# Patient Record
Sex: Female | Born: 1970
Health system: Southern US, Community
[De-identification: ages and names within clinical notes are randomized; demographics above are authoritative.]

## PROBLEM LIST (undated history)

## (undated) DIAGNOSIS — T7840XA Allergy, unspecified, initial encounter: Secondary | ICD-10-CM

## (undated) DIAGNOSIS — I96 Gangrene, not elsewhere classified: Secondary | ICD-10-CM

## (undated) DIAGNOSIS — K219 Gastro-esophageal reflux disease without esophagitis: Secondary | ICD-10-CM

## (undated) DIAGNOSIS — F419 Anxiety disorder, unspecified: Secondary | ICD-10-CM

## (undated) DIAGNOSIS — M069 Rheumatoid arthritis, unspecified: Secondary | ICD-10-CM

## (undated) DIAGNOSIS — I8290 Acute embolism and thrombosis of unspecified vein: Secondary | ICD-10-CM

## (undated) DIAGNOSIS — K2270 Barrett's esophagus without dysplasia: Secondary | ICD-10-CM

## (undated) DIAGNOSIS — Z049 Encounter for examination and observation for unspecified reason: Secondary | ICD-10-CM

## (undated) DIAGNOSIS — I7300 Raynaud's syndrome without gangrene: Secondary | ICD-10-CM

## (undated) HISTORY — DX: Barrett's esophagus without dysplasia: K22.70

## (undated) HISTORY — DX: Anxiety disorder, unspecified: F41.9

## (undated) HISTORY — DX: Allergy, unspecified, initial encounter: T78.40XA

## (undated) HISTORY — DX: Gangrene, not elsewhere classified: I96

## (undated) HISTORY — PX: ABDOMINAL HYSTERECTOMY: SHX81

## (undated) HISTORY — DX: Encounter for examination and observation for unspecified reason: Z04.9

---
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
## 1993-03-17 HISTORY — PX: CHOLECYSTECTOMY: SHX55

## 1993-03-17 HISTORY — PX: TUBAL LIGATION: SHX77

## 2000-05-15 HISTORY — PX: GROIN DISSECTION: SUR421

## 2001-03-17 DIAGNOSIS — T7840XA Allergy, unspecified, initial encounter: Secondary | ICD-10-CM

## 2001-03-17 HISTORY — DX: Allergy, unspecified, initial encounter: T78.40XA

## 2004-04-24 ENCOUNTER — Ambulatory Visit: Payer: Self-pay | Admitting: Family Medicine

## 2004-07-15 HISTORY — PX: MRI: SHX5353

## 2004-08-08 ENCOUNTER — Ambulatory Visit: Payer: Self-pay | Admitting: Family Medicine

## 2004-08-12 ENCOUNTER — Encounter: Admission: RE | Admit: 2004-08-12 | Discharge: 2004-08-12 | Payer: Self-pay | Admitting: Family Medicine

## 2004-08-27 ENCOUNTER — Ambulatory Visit: Payer: Self-pay | Admitting: Family Medicine

## 2004-11-27 ENCOUNTER — Ambulatory Visit: Payer: Self-pay | Admitting: Family Medicine

## 2005-02-19 ENCOUNTER — Encounter: Payer: Self-pay | Admitting: Family Medicine

## 2005-02-19 ENCOUNTER — Ambulatory Visit: Payer: Self-pay | Admitting: Family Medicine

## 2005-02-20 ENCOUNTER — Other Ambulatory Visit: Admission: RE | Admit: 2005-02-20 | Discharge: 2005-02-20 | Payer: Self-pay | Admitting: Family Medicine

## 2005-04-29 ENCOUNTER — Ambulatory Visit: Payer: Self-pay | Admitting: Family Medicine

## 2005-04-29 LAB — HM MAMMOGRAPHY: HM Mammogram: NORMAL

## 2006-09-21 ENCOUNTER — Encounter: Payer: Self-pay | Admitting: Family Medicine

## 2006-09-21 DIAGNOSIS — M129 Arthropathy, unspecified: Secondary | ICD-10-CM | POA: Insufficient documentation

## 2006-09-21 DIAGNOSIS — J309 Allergic rhinitis, unspecified: Secondary | ICD-10-CM | POA: Insufficient documentation

## 2006-09-21 DIAGNOSIS — Z87891 Personal history of nicotine dependence: Secondary | ICD-10-CM | POA: Insufficient documentation

## 2006-09-21 DIAGNOSIS — Z9189 Other specified personal risk factors, not elsewhere classified: Secondary | ICD-10-CM | POA: Insufficient documentation

## 2006-09-22 ENCOUNTER — Ambulatory Visit: Payer: Self-pay | Admitting: Family Medicine

## 2006-09-22 DIAGNOSIS — F17200 Nicotine dependence, unspecified, uncomplicated: Secondary | ICD-10-CM | POA: Insufficient documentation

## 2006-09-22 DIAGNOSIS — M79609 Pain in unspecified limb: Secondary | ICD-10-CM | POA: Insufficient documentation

## 2006-09-22 LAB — CONVERTED CEMR LAB
AST: 17 units/L (ref 0–37)
Albumin: 3.9 g/dL (ref 3.5–5.2)
Alkaline Phosphatase: 69 units/L (ref 39–117)
BUN: 5 mg/dL — ABNORMAL LOW (ref 6–23)
Basophils Absolute: 0.1 10*3/uL (ref 0.0–0.1)
Chloride: 105 meq/L (ref 96–112)
Eosinophils Absolute: 0.2 10*3/uL (ref 0.0–0.6)
GFR calc non Af Amer: 75 mL/min
HCT: 41.5 % (ref 36.0–46.0)
MCHC: 34.4 g/dL (ref 30.0–36.0)
MCV: 93.4 fL (ref 78.0–100.0)
Monocytes Relative: 5.1 % (ref 3.0–11.0)
Neutrophils Relative %: 45.8 % (ref 43.0–77.0)
Platelets: 310 10*3/uL (ref 150–400)
RBC: 4.45 M/uL (ref 3.87–5.11)
Sed Rate: 11 mm/hr (ref 0–25)
Sodium: 141 meq/L (ref 135–145)
Uric Acid, Serum: 4.3 mg/dL (ref 2.4–7.0)

## 2006-09-23 ENCOUNTER — Ambulatory Visit: Payer: Self-pay | Admitting: Vascular Surgery

## 2006-09-23 ENCOUNTER — Encounter: Payer: Self-pay | Admitting: Family Medicine

## 2006-09-23 HISTORY — PX: OTHER SURGICAL HISTORY: SHX169

## 2006-10-08 ENCOUNTER — Encounter: Payer: Self-pay | Admitting: Family Medicine

## 2006-11-20 ENCOUNTER — Ambulatory Visit: Payer: Self-pay | Admitting: Family Medicine

## 2006-11-20 DIAGNOSIS — F411 Generalized anxiety disorder: Secondary | ICD-10-CM | POA: Insufficient documentation

## 2006-11-20 DIAGNOSIS — R079 Chest pain, unspecified: Secondary | ICD-10-CM | POA: Insufficient documentation

## 2006-11-27 ENCOUNTER — Ambulatory Visit: Payer: Self-pay | Admitting: Family Medicine

## 2007-03-25 ENCOUNTER — Ambulatory Visit: Payer: Self-pay | Admitting: Family Medicine

## 2007-03-25 DIAGNOSIS — R42 Dizziness and giddiness: Secondary | ICD-10-CM | POA: Insufficient documentation

## 2007-03-31 ENCOUNTER — Encounter: Payer: Self-pay | Admitting: Family Medicine

## 2007-04-28 ENCOUNTER — Encounter: Payer: Self-pay | Admitting: Family Medicine

## 2007-04-28 DIAGNOSIS — Z049 Encounter for examination and observation for unspecified reason: Secondary | ICD-10-CM

## 2007-04-28 HISTORY — DX: Encounter for examination and observation for unspecified reason: Z04.9

## 2007-06-02 ENCOUNTER — Encounter: Payer: Self-pay | Admitting: Family Medicine

## 2007-07-26 ENCOUNTER — Ambulatory Visit: Payer: Self-pay | Admitting: Family Medicine

## 2007-07-26 DIAGNOSIS — I7300 Raynaud's syndrome without gangrene: Secondary | ICD-10-CM | POA: Insufficient documentation

## 2007-08-27 ENCOUNTER — Ambulatory Visit: Payer: Self-pay | Admitting: Family Medicine

## 2007-08-30 ENCOUNTER — Emergency Department: Payer: Self-pay | Admitting: Emergency Medicine

## 2007-08-31 ENCOUNTER — Ambulatory Visit: Payer: Self-pay | Admitting: Family Medicine

## 2007-08-31 DIAGNOSIS — L989 Disorder of the skin and subcutaneous tissue, unspecified: Secondary | ICD-10-CM | POA: Insufficient documentation

## 2007-09-01 ENCOUNTER — Encounter: Payer: Self-pay | Admitting: Family Medicine

## 2008-02-23 ENCOUNTER — Ambulatory Visit: Payer: Self-pay | Admitting: Family Medicine

## 2009-02-18 ENCOUNTER — Emergency Department: Payer: Self-pay | Admitting: Emergency Medicine

## 2009-03-22 ENCOUNTER — Telehealth: Payer: Self-pay | Admitting: Family Medicine

## 2009-07-03 ENCOUNTER — Ambulatory Visit: Payer: Self-pay | Admitting: Family Medicine

## 2009-10-08 ENCOUNTER — Telehealth: Payer: Self-pay | Admitting: Family Medicine

## 2009-10-23 ENCOUNTER — Encounter (INDEPENDENT_AMBULATORY_CARE_PROVIDER_SITE_OTHER): Payer: Self-pay | Admitting: *Deleted

## 2009-10-30 ENCOUNTER — Ambulatory Visit: Payer: Self-pay | Admitting: Family Medicine

## 2009-10-30 DIAGNOSIS — J209 Acute bronchitis, unspecified: Secondary | ICD-10-CM | POA: Insufficient documentation

## 2010-01-14 ENCOUNTER — Ambulatory Visit: Payer: Self-pay | Admitting: Family Medicine

## 2010-04-16 NOTE — Assessment & Plan Note (Signed)
Summary: sore throat, congestion, cough/alc   Vital Signs:  Patient profile:   40 year old female Weight:      121.25 pounds O2 Sat:      97 % on Room air Temp:     98.8 degrees F oral Pulse rate:   88 / minute Pulse rhythm:   regular BP sitting:   112 / 80  (left arm) Cuff size:   regular  Vitals Entered By: Sydell Axon LPN (October 30, 2009 2:25 PM)  O2 Flow:  Room air CC: Sore throat, chest congestion, cough and tightness in chest   History of Present Illness: duration of symptoms: 5 days rhinorrhea:yes, initially congestion:yes no fevers ear pain: occ pain sore throat: minimal; raspy from coughing cough:yes, no sputum myalgias: back aches from coughing other concerns: smoker, prev PPD.  decreased over last few days.  husband sick with pulmonary embolism Prev was on advair.  Not on SABA currently.   Had been taking left over amoxil w/o relief.    Allergies: 1)  ! Sulfamethoxazole (Sulfamethoxazole)  Review of Systems       See HPI.  Otherwise negative.    Physical Exam  General:  GEN: nad, alert and oriented HEENT: mucous membranes moist, TM w/o erythema, nasal epithelium injected, OP with cobblestoning NECK: supple w/o LA CV: rrr. PULM: diffuse wheeze and ronchi bilaterally, no inc wob.  decrease in wheeze and ronchi after tx with albuterol HFA here in clinic.   ABD: soft, +bs EXT: no edema    Impression & Recommendations:  Problem # 1:  ACUTE BRONCHITIS (ICD-466.0)  Sent home with albuterol HFA.  Routine instructions/demonstrated and she understood.  follow up as needed.  d/w patient ZO:XWRUEAV.  I would go ahead and treat with antibiotics given the exam.  Nontoxic.   Her updated medication list for this problem includes:    Zithromax Z-pak 250 Mg Tabs (Azithromycin) .Marland Kitchen... 2 tabs today and then 1 by mouth qday x4d  Complete Medication List: 1)  Zoloft 50 Mg Tabs (Sertraline hcl) .... Take one by mouth daily 2)  Alprazolam 0.25 Mg Tabs (Alprazolam)  .... One tab by mouth two times a day as needed 3)  Zithromax Z-pak 250 Mg Tabs (Azithromycin) .... 2 tabs today and then 1 by mouth qday x4d  Patient Instructions: 1)  Use the inhaler every 4 hours as needed for cough or wheeze.  Take the antibiotics and follow up as needed.   Prescriptions: ZITHROMAX Z-PAK 250 MG TABS (AZITHROMYCIN) 2 tabs today and then 1 by mouth qday x4d  #6 tabs x 0   Entered and Authorized by:   Crawford Givens MD   Signed by:   Crawford Givens MD on 10/30/2009   Method used:   [facility]        [facility]  [address] [phone][facility] (retail[facility]       [address]       [place], [place]  [address]       Ph: [phone]       Fax: [phone]   RxID:   [id number]   Current Allergies (reviewed today): ! SULFAMETHOXAZOLE (SULFAMETHOXAZOLE)

## 2010-04-16 NOTE — Assessment & Plan Note (Signed)
Summary: med refill/rbh   Vital Signs:  Patient profile:   40 year old female Height:      65 inches Weight:      126.25 pounds BMI:     21.09 Temp:     98.6 degrees F oral Pulse rate:   84 / minute Pulse rhythm:   regular BP sitting:   100 / 70  (left arm) Cuff size:   regular  Vitals Entered By: Sydell Axon LPN (July 03, 2009 11:52 AM) CC: Needs a refill on medication   History of Present Illness: Pt here for refill of her Zoloft. She has stress due to mild fmily issues and financial issues. She has done well with the Zoloft and would like to continue.  She also relates that she has not been using Xanax for some time but there are times when she would like to have some. Overall she has survived ok. Shenis still smoking, is back to it after having been successful at quitting using Chantix and would like a prescription for it again. She tolerated it fine.  Problems Prior to Update: 1)  Skin Lesions, Multiple  (ICD-709.9) 2)  Raynauds Syndrome  (ICD-443.0) 3)  Vertigo  (ICD-780.4) 4)  Anxiety State Nos  (ICD-300.00) 5)  Chest Pain, Acute  (ICD-786.50) 6)  Tobacco Abuse  (ICD-305.1) 7)  Thumb Pain, Left  (ICD-729.5) 8)  Headache, Chronic, Hx of  (ICD-V15.9) 9)  Hx, Personal, Tobacco Use  (ICD-V15.82) 10)  Arthritis  (ICD-716.90) 11)  Allergic Rhinitis  (ICD-477.9)  Medications Prior to Update: 1)  Zoloft 50 Mg  Tabs (Sertraline Hcl) .... 1/2 Tab  By Mouth For Four Days, Then One Pill. 2)  Alprazolam 0.25 Mg  Tbdp (Alprazolam) .... One Tab By Mouth Bid  Allergies: 1)  ! Sulfamethoxazole (Sulfamethoxazole)  Physical Exam  General:  Well-developed,well-nourished,in no acute distress; alert,appropriate and cooperative throughout examination Head:  Normocephalic and atraumatic without obvious abnormalities. No apparent alopecia or balding. Eyes:  Conjunctiva clear bilaterally.  Ears:  External ear exam shows no significant lesions or deformities.  Otoscopic examination  reveals clear canals, tympanic membranes are intact bilaterally without bulging, retraction, inflammation or discharge. Hearing is grossly normal bilaterally. Nose:  External nasal examination shows no deformity or inflammation. Nasal mucosa are pink and moist without lesions or exudates. Mouth:  Oral mucosa and oropharynx without lesions or exudates.  Teeth in good repair. Neck:  No deformities, masses, or tenderness noted. Chest Wall:  No deformities, masses, or tenderness noted. Lungs:  Normal respiratory effort, chest expands symmetrically. Lungs are clear to auscultation, no crackles or wheezes. Heart:  Normal rate and regular rhythm. S1 and S2 normal without gallop, murmur, click, rub or other extra sounds. Abdomen:  Bowel sounds positive,abdomen soft and non-tender without masses, organomegaly or hernias noted.   Impression & Recommendations:  Problem # 1:  ANXIETY STATE NOS (ICD-300.00) Assessment Unchanged  Stable on Zoloft. Will add some Xanax for augmentation.  The following medications were removed from the medication list:    Alprazolam 0.25 Mg Tbdp (Alprazolam) ..... One tab by mouth bid Her updated medication list for this problem includes:    Zoloft 50 Mg Tabs (Sertraline hcl) .Marland Kitchen... Take one by mouth daily    Alprazolam 0.25 Mg Tabs (Alprazolam) ..... One tab by mouth two times a day as needed  Problem # 2:  TOBACCO ABUSE (ICD-305.1) Assessment: Deteriorated Back smoking again...will try Chantix again. Discussed tapering up and tapering off. Her updated medication list for this  problem includes:    Chantix 1 Mg Tabs (Varenicline tartrate) ..... One tab by mouth two times a day  Problem # 3:  RAYNAUDS SYNDROME (ICD-443.0) Assessment: Unchanged Stable. Has not had any acute problems lately.  Complete Medication List: 1)  Zoloft 50 Mg Tabs (Sertraline hcl) .... Take one by mouth daily 2)  Alprazolam 0.25 Mg Tabs (Alprazolam) .... One tab by mouth two times a day as  needed 3)  Chantix 1 Mg Tabs (Varenicline tartrate) .... One tab by mouth two times a day  Patient Instructions: 1)  RTC as needed. Prescriptions: CHANTIX 1 MG TABS (VARENICLINE TARTRATE) one tab by mouth two times a day  #60 x 5   Entered and Authorized by:   Shaune Leeks MD   Signed by:   Shaune Leeks MD on 07/03/2009   Method used:   Print then Give to Patient   RxID:   [id number] ALPRAZOLAM 0.25 MG TABS (ALPRAZOLAM) one tab by mouth two times a day as needed  #60 x 5   Entered and Authorized by:   Shaune Leeks MD   Signed by:   Shaune Leeks MD on 07/03/2009   Method used:   Print then Give to Patient   RxID:   [id number] ZOLOFT 50 MG  TABS (SERTRALINE HCL) Take one by mouth daily  #30 x 12   Entered and Authorized by:   Shaune Leeks MD   Signed by:   Shaune Leeks MD on 07/03/2009   Method used:   [facility]        [facility] [phone]* (retail)       [address]       [place], [place]  [address]       Ph: [phone]       Fax: [phone]   RxID:   [id number]   Current Allergies (reviewed today): ! SULFAMETHOXAZOLE (SULFAMETHOXAZOLE)

## 2010-04-16 NOTE — Letter (Signed)
Summary: Nadara Eaton letter  [place] [facility]  [address] [place], [place] [address]   Phone: [phone]  Fax: [phone]       10/23/2009 MRN: [id number]  Krystal Mercado [address] [place], [place]  [address]  Dear Ms. [name] and [facility] announce the retirement of Arta Silence, M.D., from full-time practice at the [facility] office effective September 13, 2009 and his plans of returning part-time.  It is important to Dr. Hetty Ely and to our practice that you understand that [facility] has seven physicians in our office for your health care needs.  We will continue to offer the same exceptional care that you have today.    Dr. Hetty Ely has spoken to many of you about his plans for retirement and returning part-time in the fall.   We will continue to work with you through the transition to schedule appointments for you in the office and meet the high standards that [place] is committed to.   Again, it is with great pleasure that we share the news that Dr. Hetty Ely will return to [facility] in October of 2011 with a reduced schedule.    If you have any questions, or would like to request an appointment with one of our physicians, please call us at [phone] and press the option for Scheduling an appointment.  We take pleasure in providing you with excellent patient care and look forward to seeing you at your next office visit.  Our [facility] Physicians are:  Tillman Abide, M.D. Laurita Quint, M.D. Roxy Manns, M.D. Kerby Nora, M.D. Hannah Beat, M.D. Ruthe Mannan, M.D. We proudly welcomed Raechel Ache, M.D. and Eustaquio Boyden, M.D. to the practice in July/August 2011.  Sincerely,  [facility]

## 2010-04-16 NOTE — Assessment & Plan Note (Signed)
Summary: CHEST CONGESTION/DLO   Vital Signs:  Patient profile:   40 year old female Height:      65 inches Weight:      121.50 pounds BMI:     20.29 Temp:     97.9 degrees F oral Pulse rate:   80 / minute Pulse rhythm:   regular BP sitting:   110 / 80  (left arm) Cuff size:   regular  Vitals Entered By: Delilah Shan CMA Hazeline Charnley Dull) (January 14, 2010 3:02 PM) CC: Chest congestion   History of Present Illness: "I've got a cold in my chest."  Nonproductive cough.  Wheezing.  Started Friday.  Took some leftover amoxil w/o relief.  No FNAVD.  Occ chills.  Chest feels tight.  Voice change, ST improved today.  Had prev used SABA with some relief, but out of it now.  Smoker, thinking about quitting.   Allergies: 1)  ! Sulfamethoxazole (Sulfamethoxazole)  Review of Systems       See HPI.  Otherwise negative.    Physical Exam  General:  GEN: nad, alert and oriented HEENT: mucous membranes moist, TM w/o erythema, nasal epithelium injected, OP without cobblestoning NECK: supple w/o LA CV: rrr. PULM: diffuse wheeze and ronchi bilaterally, no inc wob.   ABD: soft, +bs EXT: no edema    Impression & Recommendations:  Problem # 1:  ACUTE BRONCHITIS (ICD-466.0) Start antibiotics as below and restart SABA.  I d/w patient re: tobacco and she is considering stopping.  I am hopeful that she'll try to follow through with this.  Okay for outpatient follow up.  No increase in wob.  She understands the plan.  follow up as needed.  The following medications were removed from the medication list:    Zithromax Z-pak 250 Mg Tabs (Azithromycin) .Marland Kitchen... 2 tabs today and then 1 by mouth qday x4d Her updated medication list for this problem includes:    Zithromax 250 Mg Tabs (Azithromycin) .Marland Kitchen... 2 by mouth once daily today and then 1 by mouth once daily for 4 days.    Proair Hfa 108 (90 Base) Mcg/act Aers (Albuterol sulfate) .Marland Kitchen... 2 puffs q4h as needed for cough  Complete Medication List: 1)  Zoloft 50 Mg  Tabs (Sertraline hcl) .... Take one by mouth daily 2)  Alprazolam 0.25 Mg Tabs (Alprazolam) .... One tab by mouth two times a day as needed 3)  Zithromax 250 Mg Tabs (Azithromycin) .... 2 by mouth once daily today and then 1 by mouth once daily for 4 days. 4)  Proair Hfa 108 (90 Base) Mcg/act Aers (Albuterol sulfate) .... 2 puffs q4h as needed for cough  Patient Instructions: 1)  [place] the inhaler as needed and take the antibiotics as directed.  This should gradually get better.  Let us know if we can help you get off cigarettes.  Take care.  Follow up as needed.  Prescriptions: PROAIR HFA 108 (90 BASE) MCG/ACT AERS (ALBUTEROL SULFATE) 2 puffs q4h as needed for cough  #1 x 1   Entered and Authorized by:   Crawford Givens MD   Signed by:   Crawford Givens MD on 01/14/2010   Method used:   [facility]        [facility]  [address] [phone]* (retail)       [address]       [place], [place]  [address]       Ph: [phone]  Fax: [phone]   RxID:   [id number] ZITHROMAX 250 MG TABS (AZITHROMYCIN) 2 by mouth once daily today and then 1 by mouth once daily for 4 days.  #6 x 0   Entered and Authorized by:   Crawford Givens MD   Signed by:   Crawford Givens MD on 01/14/2010   Method used:   [facility]        [facility]  [address] [phone]* (retail)       [address]       [place], [place]  [address]       Ph: [phone]       Fax: [phone]   RxID:   [id number]    Orders Added: 1)  Est. Patient Level III [01093]    Current Allergies (reviewed today): ! SULFAMETHOXAZOLE (SULFAMETHOXAZOLE)

## 2010-04-16 NOTE — Progress Notes (Signed)
Summary: ok to increase xanax?  Phone Note Call from Patient   Caller: Patient Summary of Call: Pt states she is under a lot of stress with taking care of her husband, who just got out of the hospital with heart failure and pulmonary embolism.  She is taking one xanax at bedtime, her instructions say she can take 2 a day.  Advised pt ok to increase to 2 a day if she tolerates this.  Advised her to continue with the one she takes at bedtime and try taking one half during the day, if that doesnt help she can increase to a whole one during the day, but warned her about drowsiness. Initial call taken by: Lowella Petties CMA,  October 08, 2009 12:20 PM  Follow-up for Phone Call        agree and follow up as needed.  thanks.  Follow-up by: Crawford Givens MD,  October 08, 2009 12:49 PM

## 2010-04-16 NOTE — Progress Notes (Signed)
Summary: Rx Zoloft  Phone Note Refill Request Call back at [phone] Message [facility] on March 22, 2009 9:01 AM  Refills Requested: Medication #1:  ZOLOFT 50 MG  TABS 1/2 tab  by mouth for four days   Last Refilled: 02/22/2009 Received refill request, please advise   Method Requested: Electronic Initial call taken by: Linde Gillis CMA Duncan Dull),  March 22, 2009 9:02 AM  Follow-up for Phone Call        Rx called to pharmacy. Spoke with patient and advised her to schedule an appointment with Dr. Hetty Ely this month.  She says she will call back to schedule the appointmet, she has no insurance that's why she had not scheduled an appointment before now. Follow-up by: Linde Gillis CMA Duncan Dull),  March 22, 2009 9:45 AM    Prescriptions: ZOLOFT 50 MG  TABS (SERTRALINE HCL) 1/2 tab  by mouth for four days, then one pill.  #30 x 0   Entered and Authorized by:   Shaune Leeks MD   Signed by:   Shaune Leeks MD on 03/22/2009   Method used:   Telephoned to ...         RxID:   [id number]  Please have pt make appt to be seen within the next month. Shaune Leeks MD  March 22, 2009 9:26 AM

## 2010-07-22 ENCOUNTER — Other Ambulatory Visit: Payer: Self-pay | Admitting: Family Medicine

## 2010-09-16 ENCOUNTER — Other Ambulatory Visit: Payer: Self-pay | Admitting: *Deleted

## 2010-09-16 MED ORDER — SERTRALINE HCL 50 MG PO TABS: 50.0000 mg | ORAL_TABLET | Freq: Every day | ORAL | Status: DC

## 2010-09-21 ENCOUNTER — Encounter: Payer: Self-pay | Admitting: Family Medicine

## 2010-10-02 ENCOUNTER — Ambulatory Visit (INDEPENDENT_AMBULATORY_CARE_PROVIDER_SITE_OTHER): Payer: Self-pay | Admitting: Family Medicine

## 2010-10-02 ENCOUNTER — Encounter: Payer: Self-pay | Admitting: Family Medicine

## 2010-10-02 DIAGNOSIS — I7300 Raynaud's syndrome without gangrene: Secondary | ICD-10-CM

## 2010-10-02 DIAGNOSIS — Z87891 Personal history of nicotine dependence: Secondary | ICD-10-CM

## 2010-10-02 DIAGNOSIS — F411 Generalized anxiety disorder: Secondary | ICD-10-CM

## 2010-10-02 DIAGNOSIS — Z23 Encounter for immunization: Secondary | ICD-10-CM

## 2010-10-02 MED ORDER — ALPRAZOLAM 0.25 MG PO TABS: 0.2500 mg | ORAL_TABLET | Freq: Every evening | ORAL | Status: DC | PRN

## 2010-10-02 MED ORDER — SERTRALINE HCL 50 MG PO TABS: 50.0000 mg | ORAL_TABLET | Freq: Every day | ORAL | Status: DC

## 2010-10-02 NOTE — Progress Notes (Signed)
  Subjective:    Patient ID: Krystal Mercado, female    DOB: 07-21-1970, 40 y.o.   MRN: [id number]  HPI Pt here for refill of her medications. She has been seen for bronchitis by Dr Para March in my absence but otherwise not seen in a year. She has been busy but good.  She has done well and really has no complaints. 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 The business is all blue on the outside. She has not had a PAp in a while. Last mammo was years ago as well. She prefers not to organize that today.    Review of Systems  Constitutional: Negative for fever, chills, diaphoresis, activity change and fatigue.  HENT: Negative for ear pain, congestion, rhinorrhea and postnasal drip.   Eyes: Negative for redness.  Respiratory: Negative for cough, chest tightness, shortness of breath and wheezing.   Cardiovascular: Negative for chest pain.       Objective:   Physical Exam  Constitutional: She appears well-developed and well-nourished. No distress.  HENT:  Head: Normocephalic and atraumatic.  Right Ear: External ear normal.  Left Ear: External ear normal.  Nose: Nose normal.  Mouth/Throat: Oropharynx is clear and moist. No oropharyngeal exudate.  Eyes: Conjunctivae and EOM are normal. Pupils are equal, round, and reactive to light.  Neck: Normal range of motion. Neck supple. No thyromegaly present.  Cardiovascular: Normal rate, regular rhythm and normal heart sounds.   Pulmonary/Chest: Effort normal and breath sounds normal. She has no wheezes. She has no rales.  Lymphadenopathy:    She has no cervical adenopathy.  Skin: She is not diaphoretic.          Assessment & Plan:

## 2010-10-02 NOTE — Assessment & Plan Note (Signed)
Continues smoking which, if she could stop, would help multiple problems, Raynaud's and bronchitis to name two. Discussed.

## 2010-10-02 NOTE — Assessment & Plan Note (Signed)
Smoking cessation would help. ASA is reasonable approach in the meantime.

## 2010-10-02 NOTE — Patient Instructions (Signed)
Return after the first of the year to establish with new doctor and get heath maintenance back up to date.

## 2010-10-02 NOTE — Assessment & Plan Note (Signed)
Cont Sertraline as seems to help and is not a worry with habituation. She also requests some Xanax if possible. She sleeps better with being able to take it, hasn't had any for a while.  Both scripts printed and signed and given to the pt.

## 2011-06-05 ENCOUNTER — Ambulatory Visit: Payer: Self-pay | Admitting: Vascular Surgery

## 2011-06-05 LAB — BASIC METABOLIC PANEL
Anion Gap: 9 (ref 7–16)
Calcium, Total: 9 mg/dL (ref 8.5–10.1)
Chloride: 105 mmol/L (ref 98–107)
Co2: 25 mmol/L (ref 21–32)
Osmolality: 276 (ref 275–301)

## 2011-06-06 ENCOUNTER — Encounter: Payer: Self-pay | Admitting: Family Medicine

## 2011-06-06 ENCOUNTER — Other Ambulatory Visit: Payer: Self-pay | Admitting: *Deleted

## 2011-06-06 MED ORDER — SERTRALINE HCL 50 MG PO TABS: 50.0000 mg | ORAL_TABLET | Freq: Every day | ORAL | Status: DC

## 2011-07-18 ENCOUNTER — Telehealth: Payer: Self-pay | Admitting: Family Medicine

## 2011-07-18 NOTE — Telephone Encounter (Signed)
Pt is needing Oxycodon 5 mg she was prescribed from ER. She is taking 1 to 2 every 4 to 6 hours. She is having vascular problem with left thumb and left index finger and in a lot of pain. Left thumb is almost completely black. She had been referred to vascular surgeon but cant get in to see him until 5 /8/13. She was only given 30 Oxycodon. Tried to call ER and they told her to call primary care.

## 2011-07-18 NOTE — Telephone Encounter (Signed)
I called her.  The previously grey skin is now black and shiny.  The affected area has enlarged.  I reviewed the [facility] notes, advised her to go to ER, [facility] if possible as they had seen patient recently.  She understood.  I wouldn't delay ER eval.

## 2011-07-31 ENCOUNTER — Encounter: Payer: Self-pay | Admitting: Family Medicine

## 2011-08-27 ENCOUNTER — Other Ambulatory Visit: Payer: Self-pay

## 2011-08-27 MED ORDER — SERTRALINE HCL 50 MG PO TABS: 50.0000 mg | ORAL_TABLET | Freq: Every day | ORAL | Status: DC

## 2011-08-27 NOTE — Telephone Encounter (Signed)
Pt request refill sertraline [facility]. Pt out of med. Last seen by Dr Hetty Ely 10/02/10. Pt has lost insurance and when makes appt to see Dr Para March can it be f/u appt rather than CPX?

## 2011-08-27 NOTE — Telephone Encounter (Signed)
Advised patient, follow up appt made.

## 2011-08-27 NOTE — Telephone Encounter (Signed)
F/u is fine. rx sent.

## 2011-09-01 ENCOUNTER — Encounter: Payer: Self-pay | Admitting: Family Medicine

## 2011-09-01 ENCOUNTER — Ambulatory Visit (INDEPENDENT_AMBULATORY_CARE_PROVIDER_SITE_OTHER): Payer: Self-pay | Admitting: Family Medicine

## 2011-09-01 VITALS — BP 142/86 | HR 94 | Temp 98.6°F | Wt 121.8 lb

## 2011-09-01 DIAGNOSIS — F411 Generalized anxiety disorder: Secondary | ICD-10-CM

## 2011-09-01 DIAGNOSIS — M79609 Pain in unspecified limb: Secondary | ICD-10-CM

## 2011-09-01 MED ORDER — ALPRAZOLAM 0.25 MG PO TABS: 0.2500 mg | ORAL_TABLET | Freq: Every evening | ORAL | Status: DC | PRN

## 2011-09-01 NOTE — Patient Instructions (Addendum)
Take the sertraline daily and the xanax at night.  Take care.  Let me know if I can help you stop smoking.  Take care.

## 2011-09-01 NOTE — Progress Notes (Signed)
Per patient, she was deemed "medically uninsurable" by her insurance company.  L thumb with distal necrosis.  She has f/u pending with the hand surgery clinic but further imaging was deferred at this point due to cost.  She is off pain meds and NTG.  The L thumb lesion is distally necrotic and is loosening up.  Still smoking, discussed.    Divorce was pending as of 2013, unclear if this will proceed and is still living with her husband.  She is safe at home.  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    She was anxious before the above happened.  Not sleeping well.  No Si/Hi.  Contacts for safety.  Sertraline helps some.  Has been off xanax, would take it at night for sleep- no help with otc sleep aids, ie melatonin, valerian root.    Meds, vitals, and allergies reviewed.   ROS: See HPI.  Otherwise, noncontributory.  nad ncat Mmm rrr ctab L thumb with distal necrotic tip but no erythema O/w sensation and vasc exam wnl for remaining digits.

## 2011-09-02 NOTE — Assessment & Plan Note (Signed)
Per vasc/hand surgery. D/w pt about smoking, cessation.

## 2011-09-02 NOTE — Assessment & Plan Note (Signed)
Continue ssri, restart bzd for sleep/anxiety.  Okay for outpatient f/u.  She's trying to work through her situation with work and marriage.

## 2012-01-21 ENCOUNTER — Other Ambulatory Visit: Payer: Self-pay | Admitting: Family Medicine

## 2012-01-21 NOTE — Telephone Encounter (Signed)
Received refill electronically. Last office visit 06/13. Is it okay to refill medication?

## 2012-01-21 NOTE — Telephone Encounter (Signed)
Sent!

## 2012-05-10 ENCOUNTER — Ambulatory Visit (INDEPENDENT_AMBULATORY_CARE_PROVIDER_SITE_OTHER): Payer: Self-pay | Admitting: Family Medicine

## 2012-05-10 ENCOUNTER — Encounter: Payer: Self-pay | Admitting: Family Medicine

## 2012-05-10 VITALS — BP 122/80 | HR 92 | Temp 98.6°F | Wt 126.0 lb

## 2012-05-10 DIAGNOSIS — R05 Cough: Secondary | ICD-10-CM

## 2012-05-10 DIAGNOSIS — R059 Cough, unspecified: Secondary | ICD-10-CM | POA: Insufficient documentation

## 2012-05-10 LAB — POCT INFLUENZA A/B: Influenza A, POC: NEGATIVE

## 2012-05-10 MED ORDER — ALBUTEROL SULFATE (2.5 MG/3ML) 0.083% IN NEBU
2.5000 mg | INHALATION_SOLUTION | Freq: Once | RESPIRATORY_TRACT | Status: AC
  Administered 2012-05-10: 2.5 mg via RESPIRATORY_TRACT

## 2012-05-10 MED ORDER — PREDNISONE 20 MG PO TABS: 20.0000 mg | ORAL_TABLET | Freq: Every day | ORAL | Status: DC

## 2012-05-10 MED ORDER — ALBUTEROL SULFATE HFA 108 (90 BASE) MCG/ACT IN AERS: 2.0000 | INHALATION_SPRAY | Freq: Four times a day (QID) | RESPIRATORY_TRACT | Status: DC | PRN

## 2012-05-10 MED ORDER — AZITHROMYCIN 250 MG PO TABS: ORAL_TABLET | ORAL | Status: DC

## 2012-05-10 NOTE — Progress Notes (Signed)
duration of symptoms: started about 5 days ago Rhinorrhea: no congestion:yes ear pain:yes, R ear sore throat: yes Cough: yes Myalgias: yes No fevers known.  Had chills and sweats prev Cutting back on smoking- planning on quitting.  Can use E cig.  Fatigued.   Chest feels tight.   Some wheeze, had used inhaler in the past but not recently.  Her abdomen feels bloated. Some watery diarrhea, nonbloody.   She hasn't had a flu shot this year.    She started on 875 amoxil starting 4 days ago, taking it BID.   ROS: See HPI.  Otherwise negative.    Meds, vitals, and allergies reviewed.   GEN: nad, alert and oriented HEENT: mucous membranes moist, TM w/o erythema, nasal epithelium injected, OP with cobblestoning, sinuses not ttp NECK: supple w/o LA CV: rrr. PULM: no inc wob but diffuse polyphonic exp wheezes and rhonchi.  Inc in air movement on recheck after neb, but still with some wheeze present; still w/o inc in WOB ABD: soft, +bs EXT: no edema

## 2012-05-10 NOTE — Assessment & Plan Note (Signed)
Flu neg, would treat for bronchitis with zmax, pred (steroid caution), and prn SABA.  D/w pt about signs that would direct f/u.  Nontoxic.  Supportive tx o/w.  She agrees.

## 2012-05-10 NOTE — Patient Instructions (Addendum)
Take the prednisone with food.  Start the antibiotics today.  Use the inhaler as needed.  Take care.  Call back as needed.

## 2012-07-20 ENCOUNTER — Other Ambulatory Visit: Payer: Self-pay | Admitting: Family Medicine

## 2012-11-12 ENCOUNTER — Telehealth: Payer: Self-pay | Admitting: Family Medicine

## 2012-11-12 MED ORDER — SERTRALINE HCL 50 MG PO TABS: 50.0000 mg | ORAL_TABLET | Freq: Every day | ORAL | Status: DC

## 2012-11-12 NOTE — Telephone Encounter (Signed)
Please call pt.  Krystal Mercado was in yesterday.  Stated wife was running low on SSRI.  Please verify with patient and send sertraline, same sig #90, no rf, if needed.  Try to get her scheduled.  Thanks.

## 2012-11-12 NOTE — Telephone Encounter (Signed)
Krystal Mercado says he will try to get her to schedule OV soon, in financial straits right now.  Rx sent.

## 2013-02-09 ENCOUNTER — Other Ambulatory Visit: Payer: Self-pay | Admitting: Family Medicine

## 2013-03-05 ENCOUNTER — Other Ambulatory Visit: Payer: Self-pay | Admitting: Family Medicine

## 2013-03-07 NOTE — Telephone Encounter (Signed)
Received refill request electronically. Last office visit 05/10/12. Is it okay to refill medication?

## 2013-03-07 NOTE — Telephone Encounter (Signed)
90 sent, schedule when possible.  Thanks.

## 2013-03-08 NOTE — Telephone Encounter (Signed)
Patient advised.

## 2013-07-07 ENCOUNTER — Encounter: Payer: Self-pay | Admitting: Family Medicine

## 2013-07-07 ENCOUNTER — Ambulatory Visit (INDEPENDENT_AMBULATORY_CARE_PROVIDER_SITE_OTHER): Payer: BC Managed Care – PPO | Admitting: Family Medicine

## 2013-07-07 VITALS — BP 126/90 | HR 90 | Temp 98.1°F | Wt 126.8 lb

## 2013-07-07 DIAGNOSIS — F411 Generalized anxiety disorder: Secondary | ICD-10-CM

## 2013-07-07 MED ORDER — SERTRALINE HCL 100 MG PO TABS: 100.0000 mg | ORAL_TABLET | Freq: Every day | ORAL | Status: DC

## 2013-07-07 NOTE — Patient Instructions (Signed)
Try to quit smoking and increase to 100mg  of zoloft a day.  If not better, then let me know.   Take care.
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
## 2013-07-08 ENCOUNTER — Telehealth: Payer: Self-pay | Admitting: Family Medicine

## 2013-07-08 NOTE — Telephone Encounter (Signed)
Relevant patient education assigned to patient using Emmi. ° °

## 2013-07-08 NOTE — Assessment & Plan Note (Signed)
Inc SSRI, discussed smoking cessation. It would make sense for her to have more migraines during high stress period.  She is safe at home.  She'll update me if the inc in SSRI isn't helpful.

## 2013-07-09 ENCOUNTER — Other Ambulatory Visit: Payer: Self-pay | Admitting: Family Medicine

## 2014-01-31 ENCOUNTER — Other Ambulatory Visit: Payer: Self-pay | Admitting: Family Medicine

## 2014-01-31 NOTE — Telephone Encounter (Signed)
Received refill request electronically from pharmacy. Please advised which dose of Zoloft patient is to be on. Last office visit 07/07/13. Is it okay to refill medication?

## 2014-02-01 NOTE — Telephone Encounter (Signed)
Should be the 100mg .  Sent.  Thanks.

## 2014-07-09 NOTE — Op Note (Signed)
PATIENT NAME:  Krystal Mercado, Krystal Mercado MR#:  [id number] DATE OF BIRTH:  18-Jul-1970  DATE OF PROCEDURE:  06/05/2011  PREOPERATIVE DIAGNOSIS: Cyanosis and pain left first and second digits worrisome for embolic disease or occlusive disease.   POSTOPERATIVE DIAGNOSIS: Cyanosis and pain left first and second digits worrisome for embolic disease or occlusive disease.  PROCEDURES: 1. Catheter placement into left brachial artery from right femoral approach.  2. Thoracic aortogram and left upper extremity angiogram.  3. StarClose closure device, right femoral artery.   SURGEON: Algernon Huxley, MD   ANESTHESIA: Local with moderate conscious sedation.   ESTIMATED BLOOD LOSS: Minimal.   CONTRAST USED: 40 mL Visipaque.   INDICATION FOR PROCEDURE: This is a 44 year old white female with recurrent cyanosis and pain in the left first and second digits. She had had this problem five years ago. She's presented with a week or two of pain and she is brought in for an angiogram for further evaluation. Risks and benefits were discussed. Informed consent was obtained.   DESCRIPTION OF PROCEDURE: The patient was brought to the Vascular Interventional Radiology Suite. Groins were shaved and prepped and a sterile surgical field was created. The right femoral head was localized with fluoroscopy. The right femoral artery was accessed with a mild amount of difficulty with a Seldinger needle. A 3-J wire and 5 French sheath were placed. The patient was given 2500 units of intravenous heparin for systemic anticoagulation. A pigtail catheter was placed in the ascending aorta and LAO projection thoracic aortogram was then performed. This showed normal origins of the great vessels without significant aneurysm or occlusive disease of the proximal great vessels. I then cannulated the left subclavian artery without difficulty with a headhunter catheter and left subclavian axillary and brachial arteries were imaged with the catheter beyond  the vertebral artery and all were normal in their appearance. I then advanced the catheter down to the brachial artery to help visualize the radial and ulnar artery in the hand. This demonstrated normal ulnar artery, decent size in her osseous artery, a smaller radial artery which was patent into the hand with what appeared to be some moderate disease of the radial artery itself within the hand. The thumb and second finger did fill more slowly but did fill and there was an incomplete palmar arch that had been previously demonstrated on an angiogram five years ago. The ulnar artery was the dominant artery. At this point there was no role for endovascular revascularization and there was flow visualized into the first and second digits, although not as brisk as digits 3, 4, and 5. I elected to terminate the procedure. The diagnostic catheter was removed. Oblique arteriogram was performed and StarClose closure device was deployed in the usual fashion with excellent hemostatic result. The patient tolerated the procedure well and was taken to the recovery room in stable condition.   ____________________________ Algernon Huxley, MD jsd:drc D: 06/05/2011 11:37:03 ET T: 06/05/2011 11:58:58 ET JOB#: [id number]  cc: Algernon Huxley, MD, <Dictator> Dr. Elsie Stain  Algernon Huxley MD ELECTRONICALLY SIGNED 06/05/2011 15:44

## 2014-09-25 ENCOUNTER — Other Ambulatory Visit: Payer: Self-pay | Admitting: Family Medicine

## 2014-09-25 NOTE — Telephone Encounter (Signed)
Electronic refill request. Last office visit:   07/07/13 Last Filled:    90 tablet 1 02/01/2014  No upcoming appts scheduled.  Please advise.

## 2014-09-26 NOTE — Telephone Encounter (Signed)
Sent. Needs f/u scheduled. Thanks.

## 2014-09-26 NOTE — Telephone Encounter (Signed)
Patient advised.

## 2015-01-26 ENCOUNTER — Other Ambulatory Visit: Payer: Self-pay | Admitting: Family Medicine

## 2015-01-31 ENCOUNTER — Other Ambulatory Visit: Payer: Self-pay | Admitting: Family Medicine

## 2015-02-01 NOTE — Telephone Encounter (Signed)
Can't fill unless OV scheduled.  Can only fill though the OV, once scheduled.   Patient was advised re: f/u on last refill in 09/2014.

## 2015-02-01 NOTE — Telephone Encounter (Signed)
Last office visit 07/07/2013.  No future appointments scheduled.  Refill?

## 2015-02-02 NOTE — Telephone Encounter (Signed)
Message left for patient to return my call.  

## 2015-02-05 NOTE — Telephone Encounter (Signed)
Patient advised.   Patient states she does not have insurance right now and will call in to schedule OV and inquire of OV charge.

## 2015-03-23 ENCOUNTER — Encounter: Payer: Self-pay | Admitting: Primary Care

## 2015-03-23 ENCOUNTER — Ambulatory Visit (INDEPENDENT_AMBULATORY_CARE_PROVIDER_SITE_OTHER): Payer: Self-pay | Admitting: Primary Care

## 2015-03-23 VITALS — BP 138/76 | HR 102 | Temp 98.1°F | Wt 126.1 lb

## 2015-03-23 DIAGNOSIS — R0602 Shortness of breath: Secondary | ICD-10-CM

## 2015-03-23 MED ORDER — PREDNISONE 10 MG PO TABS: ORAL_TABLET | ORAL | Status: DC

## 2015-03-23 MED ORDER — ALBUTEROL SULFATE HFA 108 (90 BASE) MCG/ACT IN AERS: 2.0000 | INHALATION_SPRAY | Freq: Four times a day (QID) | RESPIRATORY_TRACT | Status: DC | PRN

## 2015-03-23 NOTE — Patient Instructions (Addendum)
Start prednisone tablets for chest tightness and breathing. Take 3 tablets for 2 days, then 2 tablets for 2 days, then 1 tablet for 2 days.   Use albuterol inhaler every 6 hours as needed for wheezing/SOB.  Continue Mucinex DM and ensure you are drinking a full glass of water.  Please call me Tuesday next week if no improvement.  It was a pleasure meeting you!  Upper Respiratory Infection, Adult Most upper respiratory infections (URIs) are a viral infection of the air passages leading to the lungs. A URI affects the nose, throat, and upper air passages. The most common type of URI is nasopharyngitis and is typically referred to as "the common cold." URIs run their course and usually go away on their own. Most of the time, a URI does not require medical attention, but sometimes a bacterial infection in the upper airways can follow a viral infection. This is called a secondary infection. Sinus and middle ear infections are common types of secondary upper respiratory infections. Bacterial pneumonia can also complicate a URI. A URI can worsen asthma and chronic obstructive pulmonary disease (COPD). Sometimes, these complications can require emergency medical care and may be life threatening.  CAUSES Almost all URIs are caused by viruses. A virus is a type of germ and can spread from one person to another.  RISKS FACTORS You may be at risk for a URI if:   You smoke.   You have chronic heart or lung disease.  You have a weakened defense (immune) system.   You are very young or very old.   You have nasal allergies or asthma.  You work in crowded or poorly ventilated areas.  You work in health care facilities or schools. SIGNS AND SYMPTOMS  Symptoms typically develop 2-3 days after you come in contact with a cold virus. Most viral URIs last 7-10 days. However, viral URIs from the influenza virus (flu virus) can last 14-18 days and are typically more severe. Symptoms may include:   Runny  or stuffy (congested) nose.   Sneezing.   Cough.   Sore throat.   Headache.   Fatigue.   Fever.   Loss of appetite.   Pain in your forehead, behind your eyes, and over your cheekbones (sinus pain).  Muscle aches.  DIAGNOSIS  Your health care provider may diagnose a URI by:  Physical exam.  Tests to check that your symptoms are not due to another condition such as:  Strep throat.  Sinusitis.  Pneumonia.  Asthma. TREATMENT  A URI goes away on its own with time. It cannot be cured with medicines, but medicines may be prescribed or recommended to relieve symptoms. Medicines may help:  Reduce your fever.  Reduce your cough.  Relieve nasal congestion. HOME CARE INSTRUCTIONS   Take medicines only as directed by your health care provider.   Gargle warm saltwater or take cough drops to comfort your throat as directed by your health care provider.  Use a warm mist humidifier or inhale steam from a shower to increase air moisture. This may make it easier to breathe.  Drink enough fluid to keep your urine clear or pale yellow.   Eat soups and other clear broths and maintain good nutrition.   Rest as needed.   Return to work when your temperature has returned to normal or as your health care provider advises. You may need to stay home longer to avoid infecting others. You can also use a face mask and careful hand washing  to prevent spread of the virus.  Increase the usage of your inhaler if you have asthma.   Do not use any tobacco products, including cigarettes, chewing tobacco, or electronic cigarettes. If you need help quitting, ask your health care provider. PREVENTION  The best way to protect yourself from getting a cold is to practice good hygiene.   Avoid oral or hand contact with people with cold symptoms.   Wash your hands often if contact occurs.  There is no clear evidence that vitamin C, vitamin E, echinacea, or exercise reduces the  chance of developing a cold. However, it is always recommended to get plenty of rest, exercise, and practice good nutrition.  SEEK MEDICAL CARE IF:   You are getting worse rather than better.   Your symptoms are not controlled by medicine.   You have chills.  You have worsening shortness of breath.  You have brown or red mucus.  You have yellow or brown nasal discharge.  You have pain in your face, especially when you bend forward.  You have a fever.  You have swollen neck glands.  You have pain while swallowing.  You have white areas in the back of your throat. SEEK IMMEDIATE MEDICAL CARE IF:   You have severe or persistent:  Headache.  Ear pain.  Sinus pain.  Chest pain.  You have chronic lung disease and any of the following:  Wheezing.  Prolonged cough.  Coughing up blood.  A change in your usual mucus.  You have a stiff neck.  You have changes in your:  Vision.  Hearing.  Thinking.  Mood. MAKE SURE YOU:   Understand these instructions.  Will watch your condition.  Will get help right away if you are not doing well or get worse.   This information is not intended to replace advice given to you by your health care provider. Make sure you discuss any questions you have with your health care provider.   Document Released: 08/27/2000 Document Revised: 07/18/2014 Document Reviewed: 06/08/2013 Elsevier Interactive Patient Education [employer].

## 2015-03-23 NOTE — Progress Notes (Signed)
Subjective:    Patient ID: Krystal Mercado, female    DOB: December 15, 1970, 46 y.o.   MRN: [id number]  HPI  Krystal Mercado is a 45 year old female who presents today with a chief complaint of cough. She also reports nasal congestion, sore throat body aches. Her symptoms have been since Tuesday afternoon this week (3 days). She is a current smoker. She's taken Mucinex DM with temporary improvement. Her most bothersome symptom is her SOB and chest tightness. She works in [employer] (medical). She does not currently use an inhaler.  Review of Systems  Constitutional: Negative for fever and chills.  HENT: Positive for congestion and sore throat. Negative for ear pain.   Respiratory: Positive for cough, chest tightness and shortness of breath.   Cardiovascular: Negative for chest pain.  Musculoskeletal: Positive for myalgias.       Past Medical History  Diagnosis Date  . Allergy 2003    allergic rhinitis  . Observation or evaluation for suspected condition 04/28/2007    heme Evaluation no known pathology (Dr.Metjian, [facility])  . Skin necrosis     left thumb- vascular eval at [facility]  . Marital Status: Unknown    Spouse Name: N/A  . Number of Children: N/A  . Years of Education: N/A   Occupational History  . Not on file.   Social History Main Topics  . Smoking status: Current Every Day Smoker -- 1.00 packs/day for 25 years    Types: Cigarettes  . Smokeless tobacco: Never Used  . Alcohol Use: Yes     Comment: wine at night  . Drug Use: No  . Sexual Activity: Not on file   Other Topics Concern  . Not on file   Social History Narrative    Past Surgical History  Procedure Laterality Date  . Tubal ligation  1995    conization, BTL; choleycystectomy  . Cholecystectomy  1995    conization, BTL  . Groin dissection  05/2000    Right groin Lymph node dissection for cat scratch Disease  . Cystectomy  1990    cyst in fallopian tube removed, Tube intact    . Vaginal delivery      X's 2  . Mri  07/2004    negative except sinusitis  . Thoracic aortogram  09/23/2006    Thoracic aortogram with select LUE arteriogram small radial artery below the brach bifurcation    Family History  Problem Relation Age of Onset  . Cancer Mother 62    breast cancer/lymphoma disease 10/2004  . Hypertension Father   . Alcohol abuse Brother   . Cancer Maternal Grandmother     bone cancer //young in 30's  . Alcohol abuse Maternal Grandmother   . Stroke Maternal Grandmother   . Cancer Maternal Grandfather     ? bone cancer./ prostate/colon cancer    No Active Allergies  Current Outpatient Prescriptions on File Prior to Visit  Medication Sig Dispense Refill  . Ibuprofen 200 MG CAPS Take by mouth as needed. Reported on 03/23/2015    . sertraline (ZOLOFT) 100 MG tablet TAKE 1 TABLET (100 MG TOTAL) BY MOUTH DAILY. (Patient not taking: Reported on 03/23/2015) 90 tablet 0   No current facility-administered medications on file prior to visit.    BP 138/76 mmHg  Pulse 102  Temp(Src) 98.1 F (36.7 C) (Oral)  Wt 126 lb 1.9 oz (57.208 kg)  SpO2 97%    Objective:  Physical Exam  Constitutional: She appears well-nourished.  HENT:  Right Ear: Tympanic membrane and ear canal normal.  Left Ear: Tympanic membrane and ear canal normal.  Nose: Right sinus exhibits no maxillary sinus tenderness and no frontal sinus tenderness. Left sinus exhibits no maxillary sinus tenderness and no frontal sinus tenderness.  Mouth/Throat: Oropharynx is clear and moist.  Neck: Neck supple.  Cardiovascular: Normal rate and regular rhythm.   Pulmonary/Chest: Effort normal. She has wheezes in the right upper field and the left upper field. She has rhonchi in the right lower field and the left lower field.  Lymphadenopathy:    She has no cervical adenopathy.  Skin: Skin is warm and dry.          Assessment & Plan:  URI:  Cough x 3 days with chest tightness and some  SOB. Exam with mild rhonchi and wheezing. Current smoker. Does not use inhalers. Suspect viral URI or bronchitis perhaps. Do not suspect bacterial involvement.  Will treat with supportive measures at this point. RX for prednisone taper, albuterol inhaler sent to pharmacy. Continue mucinex and stay hydrated. Return precautions provided.

## 2015-03-23 NOTE — Progress Notes (Signed)
Pre visit review using our clinic review tool, if applicable. No additional management support is needed unless otherwise documented below in the visit note. 

## 2015-03-25 ENCOUNTER — Other Ambulatory Visit: Payer: Self-pay | Admitting: Family Medicine

## 2015-03-25 MED ORDER — SERTRALINE HCL 100 MG PO TABS: ORAL_TABLET | ORAL | Status: DC

## 2015-07-22 ENCOUNTER — Other Ambulatory Visit: Payer: Self-pay | Admitting: Family Medicine

## 2015-07-23 NOTE — Telephone Encounter (Signed)
Electronic refill request. Last Filled:    90 tablet 0 03/25/2015  Last office visit:   None in 2016 other than one acute with Anda Kraft.  Please advise.

## 2015-07-23 NOTE — Telephone Encounter (Signed)
Sent.  Needs OV scheduled.  Thanks.

## 2015-07-24 NOTE — Telephone Encounter (Signed)
Left detailed message on voicemail.  

## 2015-10-19 ENCOUNTER — Other Ambulatory Visit: Payer: Self-pay | Admitting: Family Medicine

## 2015-10-19 NOTE — Telephone Encounter (Signed)
Electronic refill request. Last Filled:    90 tablet 0 07/23/2015  Last office visit:   03/23/15 acute, no OV's prior except in 07/07/13.  Please advise.

## 2015-10-21 NOTE — Telephone Encounter (Signed)
Needs f/u.  rx sent.  Thanks.

## 2015-10-22 NOTE — Telephone Encounter (Signed)
Left detailed message on voicemail.  

## 2016-02-18 ENCOUNTER — Other Ambulatory Visit: Payer: Self-pay | Admitting: Family Medicine

## 2016-02-18 NOTE — Telephone Encounter (Signed)
Electronic refill request.  Last Filled:    90 tablet 0 10/21/2015   Last seen by Dr. Keturah Barre in 2015, seen for acute OV by Anda Kraft in Jan 16.  No CPE documented at all.  Please advise.

## 2016-02-18 NOTE — Telephone Encounter (Signed)
Okay to send in as is if she will schedule f/u.  Thanks.

## 2016-02-19 NOTE — Telephone Encounter (Signed)
Left detailed message on voicemail on 02/18/16 and 02/19/16.

## 2016-04-29 ENCOUNTER — Encounter: Payer: Self-pay | Admitting: Family Medicine

## 2016-04-29 ENCOUNTER — Ambulatory Visit (INDEPENDENT_AMBULATORY_CARE_PROVIDER_SITE_OTHER): Payer: Managed Care, Other (non HMO) | Admitting: Family Medicine

## 2016-04-29 DIAGNOSIS — F411 Generalized anxiety disorder: Secondary | ICD-10-CM

## 2016-04-29 MED ORDER — SERTRALINE HCL 100 MG PO TABS: ORAL_TABLET | ORAL | 3 refills | Status: DC

## 2016-04-29 NOTE — Progress Notes (Signed)
Anxiety f/u.  No SI/HI.  Off SSRI and anxiety worse in the meantime.  She was worried about running out and tried to slowly wean off.  After a few weeks she started to have more anxiety.   Return of panic sx in the meantime.  She was clearly improved when on medicine.  No ADE while on medicine.  She has been on it for years.    Smoking.  Not ready to quit.  D/w pt.    S/p hysterectomy.  She doesn't have a cervix, so no need for cervical cancer screening.  D/w pt.    She has had numbness along L LFCN for about 1.5 years but it is some better than prev.  Not painful.    She has had an occ sensation of flashing in the eyes.  D/w pt about getting an eye exam.     Meds, vitals, and allergies reviewed.   ROS: Per HPI unless specifically indicated in ROS section   GEN: nad, alert and oriented HEENT: mucous membranes moist NECK: supple w/o LA CV: rrr.  PULM: ctab, no inc wob ABD: soft, +bs EXT: no edema SKIN: no acute rash Change in sensation without weakness noted along the distribution of the left lateral femoral cutaneous nerve

## 2016-04-29 NOTE — Patient Instructions (Signed)
Start back on 1/2 tab of sertraline for about 2 weeks then take a whole tab.   Take care.  Glad to see you.  Update me as needed.

## 2016-04-29 NOTE — Progress Notes (Signed)
Pre visit review using our clinic review tool, if applicable. No additional management support is needed unless otherwise documented below in the visit note. 

## 2016-04-30 NOTE — Assessment & Plan Note (Addendum)
Restart sertraline. Start with a half a tablet for about 2 weeks then increase to all tablet per day. No suicidal or homicidal intent. Okay for outpatient follow-up. She agrees. >25 minutes spent in face to face time with patient, >50% spent in counselling or coordination of care.   The left meralgia paresthetica is not alarming. Discussed with patient. As long as she has no weakness no specific intervention needed. Smoking cessation discussed with patient I will await her notes from the eye clinic, encouraged follow-up.

## 2017-05-13 ENCOUNTER — Other Ambulatory Visit: Payer: Self-pay | Admitting: Family Medicine

## 2017-05-27 ENCOUNTER — Ambulatory Visit: Payer: Self-pay | Admitting: *Deleted

## 2017-05-27 NOTE — Telephone Encounter (Signed)
Pt called with left neck pain sometimes going down her left arm for about 2 weeks.she does a lot of twisting and turning of her neck to see if it would help. Now  she cant focus then she sees flashy squiggly lines in both eyes. No other symptoms. She has tried taking an NSAID but that did not help her.  Home care advice given to patient with verbal understanding.  Appointment made per protocol.  Reason for Disposition . Neck pain present > 2 weeks  Answer Assessment - Initial Assessment Questions 1. ONSET: "When did the pain begin?"      Started about 2 weeks ago  2. LOCATION: "Where does it hurt?"      Left neck 3. PATTERN "Does the pain come and go, or has it been constant since it started?"      Constant but when she turns head gets a sharp pain going up to her neck 4. SEVERITY: "How bad is the pain?"  (Scale 1-10; or mild, moderate, severe)   - MILD (1-3): doesn't interfere with normal activities    - MODERATE (4-7): interferes with normal activities or awakens from sleep    - SEVERE (8-10):  excruciating pain, unable to do any normal activities      Moderate  5. RADIATION: "Does the pain go anywhere else, shoot into your arms?"     Yes sometimes into the arm and makes the hand feel weak 6. CORD SYMPTOMS: "Any weakness or numbness of the arms or legs?"     Weakness in the left arm 7. CAUSE: "What do you think is causing the neck pain?"     Not sure 8. NECK OVERUSE: "Any recent activities that involved turning or twisting the neck?"     yes 9. OTHER SYMPTOMS: "Do you have any other symptoms?" (e.g., headache, fever, chest pain, difficulty breathing, neck swelling)     no 10. PREGNANCY: "Is there any chance you are pregnant?" "When was your last menstrual period?"       LMP, hysterectomy  Protocols used: NECK PAIN OR STIFFNESS-A-AH

## 2017-05-28 ENCOUNTER — Encounter: Payer: Self-pay | Admitting: Family Medicine

## 2017-05-28 ENCOUNTER — Ambulatory Visit: Payer: BLUE CROSS/BLUE SHIELD | Admitting: Family Medicine

## 2017-05-28 DIAGNOSIS — N393 Stress incontinence (female) (male): Secondary | ICD-10-CM

## 2017-05-28 DIAGNOSIS — M792 Neuralgia and neuritis, unspecified: Secondary | ICD-10-CM

## 2017-05-28 DIAGNOSIS — G43109 Migraine with aura, not intractable, without status migrainosus: Secondary | ICD-10-CM

## 2017-05-28 DIAGNOSIS — F17200 Nicotine dependence, unspecified, uncomplicated: Secondary | ICD-10-CM | POA: Diagnosis not present

## 2017-05-28 MED ORDER — PREDNISONE 20 MG PO TABS: ORAL_TABLET | ORAL | 0 refills | Status: DC

## 2017-05-28 NOTE — Progress Notes (Signed)
L arm and neck sx, going on for about 2-3 weeks.  Tingling and pain down the L arm.  No L sided sx.  No leg sx except for L meralgia paresthetica with LFCN at baseline over the last few years.  No foot drop.  No fevers, no chills.  R handed.  No trauma to cause the L arm sx.  Normal neck flex and ext, can still rotate neck but more tingling in L arm with neck rotation to the L  Eye complaint.  She'll see bright flashing lights and squiggly lines, with some blurry vision.  Would happen rarely but now a few times a week.  She used to have headache with the events but not now.    She has SUI but also with incontinence of diarrhea occ, going on for 2-3 years.  No blood in stool.    Smoking cessation encouraged.  She is going to work on that when the neck/arm pain is resolved.    Meds, vitals, and allergies reviewed.   ROS: Per HPI unless specifically indicated in ROS section   GEN: nad, alert and oriented HEENT: mucous membranes moist NECK: supple w/o LA but she has some tingling and pain down the L arm with neck ROM CV: rrr.  PULM: ctab, no inc wob ABD: soft, +bs EXT: no edema SKIN: no acute rash Flat DTRs x4 but symmetric.  S/S wnl o/w x4.  CN 2-12 wnl B

## 2017-05-28 NOTE — Patient Instructions (Signed)
Stop ibuprofen.  Change to prednisone with food.  Take omeprazole while on prednisone.  Gently stretch your neck.  Restart kegel exercises a few times a day.  Update me if not better.  Work on cutting back on smoking.   Take care.  Glad to see you.

## 2017-05-31 DIAGNOSIS — G43109 Migraine with aura, not intractable, without status migrainosus: Secondary | ICD-10-CM | POA: Insufficient documentation

## 2017-05-31 DIAGNOSIS — N393 Stress incontinence (female) (male): Secondary | ICD-10-CM | POA: Insufficient documentation

## 2017-05-31 DIAGNOSIS — M792 Neuralgia and neuritis, unspecified: Secondary | ICD-10-CM | POA: Insufficient documentation

## 2017-05-31 NOTE — Assessment & Plan Note (Signed)
Smoking cessation encouraged!

## 2017-05-31 NOTE — Assessment & Plan Note (Addendum)
Anatomy d/wpt.  Stop ibuprofen.  Change to prednisone with food.  Take omeprazole while on prednisone.  Gently stretch neck.  Update me if not better.  No need for imaging at this point.  Rationale discussed with patient.  She understood.

## 2017-05-31 NOTE — Assessment & Plan Note (Signed)
D/w pt.   Restart kegel exercises a few times a day.

## 2017-05-31 NOTE — Assessment & Plan Note (Signed)
It sounds like she has been having aura that would predate headaches.  This is been going on for an extended period of time.  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  Pathophysiology discussed with patient about migraines in general.  Discussed with patient about smoking cessation.  It may be that sertraline is helping some with migraine prophylaxis.  Unremarkable neurologic exam noted and she is okay for outpatient follow-up. >25 minutes spent in face to face time with patient, >50% spent in counselling or coordination of care. See AVS.

## 2017-06-10 ENCOUNTER — Other Ambulatory Visit: Payer: Self-pay | Admitting: Family Medicine

## 2017-07-30 ENCOUNTER — Emergency Department
Admission: EM | Admit: 2017-07-30 | Discharge: 2017-07-30 | Disposition: A | Discharge status: Home / Self Care | Payer: BLUE CROSS/BLUE SHIELD | Attending: Emergency Medicine | Admitting: Emergency Medicine

## 2017-07-30 ENCOUNTER — Ambulatory Visit: Payer: Self-pay

## 2017-07-30 ENCOUNTER — Encounter: Payer: Self-pay | Admitting: Emergency Medicine

## 2017-07-30 ENCOUNTER — Other Ambulatory Visit: Payer: Self-pay

## 2017-07-30 DIAGNOSIS — R1110 Vomiting, unspecified: Secondary | ICD-10-CM | POA: Diagnosis not present

## 2017-07-30 DIAGNOSIS — K921 Melena: Secondary | ICD-10-CM | POA: Diagnosis not present

## 2017-07-30 DIAGNOSIS — Z5321 Procedure and treatment not carried out due to patient leaving prior to being seen by health care provider: Secondary | ICD-10-CM | POA: Diagnosis not present

## 2017-07-30 LAB — COMPREHENSIVE METABOLIC PANEL
ALK PHOS: 83 U/L (ref 38–126)
ALT: 34 U/L (ref 14–54)
AST: 44 U/L — ABNORMAL HIGH (ref 15–41)
Albumin: 4.4 g/dL (ref 3.5–5.0)
Anion gap: 11 (ref 5–15)
BILIRUBIN TOTAL: 0.4 mg/dL (ref 0.3–1.2)
BUN: 11 mg/dL (ref 6–20)
CALCIUM: 9.4 mg/dL (ref 8.9–10.3)
CO2: 23 mmol/L (ref 22–32)
Chloride: 100 mmol/L — ABNORMAL LOW (ref 101–111)
Creatinine, Ser: 1.16 mg/dL — ABNORMAL HIGH (ref 0.44–1.00)
GFR, EST NON AFRICAN AMERICAN: 55 mL/min — AB (ref >60)
Glucose, Bld: 102 mg/dL — ABNORMAL HIGH (ref 65–99)
Potassium: 4.2 mmol/L (ref 3.5–5.1)
Sodium: 134 mmol/L — ABNORMAL LOW (ref 135–145)
Total Protein: 7.9 g/dL (ref 6.5–8.1)

## 2017-07-30 LAB — POCT PREGNANCY, URINE: PREG TEST UR: NEGATIVE

## 2017-07-30 LAB — CBC
HCT: 44.6 % (ref 35.0–47.0)
Hemoglobin: 14.9 g/dL (ref 12.0–16.0)
MCH: 33.3 pg (ref 26.0–34.0)
MCHC: 33.4 g/dL (ref 32.0–36.0)
MCV: 99.7 fL (ref 80.0–100.0)
Platelets: 291 10*3/uL (ref 150–440)
RBC: 4.47 MIL/uL (ref 3.80–5.20)
RDW: 13.4 % (ref 11.5–14.5)
WBC: 9.8 10*3/uL (ref 3.6–11.0)

## 2017-07-30 LAB — TYPE AND SCREEN
ABO/RH(D): A POS
Antibody Screen: NEGATIVE

## 2017-07-30 NOTE — ED Triage Notes (Signed)
Patient says she has had diarrhea problems for quite a long time.  Says whatever she eats comes back out quickly.  Patient is in nad.

## 2017-07-30 NOTE — Telephone Encounter (Signed)
Per notes, sent pt. To ER.  Agree with ER dispo.  Await ER notes.  Thanks.

## 2017-07-30 NOTE — ED Triage Notes (Signed)
Says Tuesday started having diarrhea dark red.  It  Has gotten brighter now.  But mostly mucus.  She is also vomiting--which also started out dark, but is more red now.

## 2017-07-30 NOTE — Telephone Encounter (Signed)
Pt.  Call  In  Stating that since Tuesday she has had coffee ground stools.  has vomited 3 times within a 24 hour period. Now pt. Is having bright red stools and vomiting,reports mucousy  Stools with blood. Toilet water becomes bright  Red water.  Pt denies taking a blood thinners. Has only voided once this am. State that at times abdomin gets distended " can be flat and  big as an basketball". Pt. States" the top of her stomach is raw". For about the last month.   Reason for Disposition . [1] MODERATE rectal bleeding (small blood clots, passing blood without stool, or toilet water turns red) AND [2] more than once a day  Answer Assessment - Initial Assessment Questions 1. APPEARANCE of BLOOD: "What does the blood look like?" (e.g., color, coffee-grounds)     *No Answer* ORANGE TO RED BLOOD. 2. AMOUNT: "How much blood was lost?"     *No Answer* 3. VOMITING BLOOD: "How many times did it happen?" or "How many times in the past 24 hours?"     VOMITING 3 TIMES  4. VOMITING WITHOUT BLOOD: "How many times in the past 24 hours?"      *No Answer* NA 5. ONSET: "When did vomiting of blood begin?"      NONE 6. CAUSE: "What do you think is causing the vomiting of blood?"     *No Answer*NONE 7. BLOOD THINNERS: "Do you take any blood thinners?" (e.g., Coumadin/warfarin, Pradaxa/dabigatran, aspirin) NONE     *No Answer* 8. DEHYDRATION: "Are there any signs of dehydration?" "When was the last time you urinated?" "Do you feel dizzy?"     NO  VOIDED THIS AM 9. ABDOMINAL PAIN: "Are you having any abdominal pain?" If yes: "What does it feel like? " (e.g., crampy, dull, intermittent, constant)      *No Answer*NONE " STOMACH FEELS RAW" 10. DIARRHEA: "Is there any diarrhea?" If so, ask: "How many times today?"       Watery mucous stool with blood in it.   11. OTHER SYMPTOMS: "Do you have any other symptoms?" (e.g., fever, blood in stool)       *No Answer* denies fever states abdominal area very bloated. And  distended. " goes from" flat to the sized of a basketball"  12. PREGNANCY: "Is there any chance you are pregnant?" "When was your last menstrual period?" hx of Hysterectomy  Answer Assessment - Initial Assessment Questions 1. APPEARANCE of BLOOD: "What color is it?" "Is it passed separately, on the surface of the stool, or mixed in with the stool?"      *No Answer*sppreace of blood is dark / orange  Red with mucous states pt. 2. AMOUNT: "How much blood was passed?"      Unable to measure that b/m filled the toillet with  With  A large mass clumps  With moucous 3. FREQUENCY: "How many times has blood been passed with the stools?"      *No Answer* 4. ONSET: "When was the blood first seen in the stools?" (Days or weeks)       Tuesday, reported coffee ground stool. 5. DIARRHEA: "Is there also some diarrhea?" If so, ask: "How many diarrhea stools were passed in past 24 hours?"     Water mucousy stool with blood 6. CONSTIPATION: "Do you have constipation?" If so, "How bad is it?"    no 7. RECURRENT SYMPTOMS: "Have you had blood in your stools before?" If so, ask: "When was the last time?"  and "What happened that time?"       no 8. BLOOD THINNERS: "Do you take any blood thinners?" (e.g., Coumadin/warfarin, Pradaxa/dabigatran, aspirin)    denies 9. OTHER SYMPTOMS: "Do you have any other symptoms?"  (e.g., abdominal pain, vomiting, dizziness, fever)      Abdomin tends to get swollen at times ." goes from flat to size of a basket ball."  10. PREGNANCY: "Is there any chance you are pregnant?" "When was your last menstrual period?"       H/o hystercotomy  Protocols used: RECTAL BLEEDING-A-AH, VOMITING BLOOD-A-AH

## 2017-07-31 ENCOUNTER — Ambulatory Visit: Payer: BLUE CROSS/BLUE SHIELD | Admitting: Family Medicine

## 2017-07-31 ENCOUNTER — Encounter: Payer: Self-pay | Admitting: Family Medicine

## 2017-07-31 ENCOUNTER — Encounter: Payer: Self-pay | Admitting: *Deleted

## 2017-07-31 ENCOUNTER — Telehealth: Payer: Self-pay | Admitting: Family Medicine

## 2017-07-31 ENCOUNTER — Other Ambulatory Visit: Payer: Self-pay

## 2017-07-31 VITALS — BP 120/84 | HR 90 | Temp 98.5°F | Ht 63.5 in | Wt 128.2 lb

## 2017-07-31 DIAGNOSIS — K922 Gastrointestinal hemorrhage, unspecified: Secondary | ICD-10-CM

## 2017-07-31 LAB — COMPREHENSIVE METABOLIC PANEL
AG Ratio: 1.5 (calc) (ref 1.0–2.5)
ALKALINE PHOSPHATASE (APISO): 77 U/L (ref 33–115)
ALT: 24 U/L (ref 6–29)
AST: 24 U/L (ref 10–35)
Albumin: 4.4 g/dL (ref 3.6–5.1)
BILIRUBIN TOTAL: 0.4 mg/dL (ref 0.2–1.2)
BUN: 9 mg/dL (ref 7–25)
CALCIUM: 9.9 mg/dL (ref 8.6–10.2)
CO2: 26 mmol/L (ref 20–32)
CREATININE: 1.01 mg/dL (ref 0.50–1.10)
Chloride: 100 mmol/L (ref 98–110)
Globulin: 2.9 g/dL (calc) (ref 1.9–3.7)
Glucose, Bld: 104 mg/dL — ABNORMAL HIGH (ref 65–99)
POTASSIUM: 4.7 mmol/L (ref 3.5–5.3)
Sodium: 134 mmol/L — ABNORMAL LOW (ref 135–146)
Total Protein: 7.3 g/dL (ref 6.1–8.1)

## 2017-07-31 LAB — CBC WITH DIFFERENTIAL/PLATELET
BASOS PCT: 1.2 %
Basophils Absolute: 130 cells/uL (ref 0–200)
EOS ABS: 367 {cells}/uL (ref 15–500)
Eosinophils Relative: 3.4 %
HEMATOCRIT: 40.5 % (ref 35.0–45.0)
Hemoglobin: 14.6 g/dL (ref 11.7–15.5)
Lymphs Abs: 5033 cells/uL — ABNORMAL HIGH (ref 850–3900)
MCH: 33.9 pg — ABNORMAL HIGH (ref 27.0–33.0)
MCHC: 36 g/dL (ref 32.0–36.0)
MCV: 94 fL (ref 80.0–100.0)
MONOS PCT: 6 %
MPV: 9.7 fL (ref 7.5–12.5)
NEUTROS PCT: 42.8 %
Neutro Abs: 4622 cells/uL (ref 1500–7800)
PLATELETS: 279 10*3/uL (ref 140–400)
RBC: 4.31 10*6/uL (ref 3.80–5.10)
RDW: 12.2 % (ref 11.0–15.0)
TOTAL LYMPHOCYTE: 46.6 %
WBC mixed population: 648 cells/uL (ref 200–950)
WBC: 10.8 10*3/uL (ref 3.8–10.8)

## 2017-07-31 MED ORDER — PANTOPRAZOLE SODIUM 40 MG PO TBEC: 40.0000 mg | DELAYED_RELEASE_TABLET | Freq: Two times a day (BID) | ORAL | 0 refills | Status: DC

## 2017-07-31 NOTE — Telephone Encounter (Signed)
I am okay with this assuming Dr. B is okay with this.  Thanks.

## 2017-07-31 NOTE — Progress Notes (Signed)
Subjective:    Patient ID: Krystal Mercado, female    DOB: [birth year], 47 y.o.   MRN: [id number]  HPI    47 year old smoker with hx of GERD presents with  1 week history of vomiting and diarrhea.   She reports history of watery stool  And bowel incontinence in last 1-2 years.  4 days ago noted black stool, coffee ground. No abdominal pain. Had some indigestion, raw in epigastrum and chest. 5/15  bright red blood in stool and bright emesis  5/16  Increase in amount   She went to ER yesterday  but left before being seen.  EKG was unremarkable  neg U preg  nml cbc  CMET nml CRP nml   Since then small non bloody emesis/ gag this AM, now BM looks black and tarry.  only mild pressure in chest.   Has not been taking omeprazole lately.  She has been using   no ASA, ibuprofen   She is tired today, no SOB, no  Exertional chest pain   Smoker.Marland Kitchen 1-2 glasses of wine a night.  Blood pressure 120/84, pulse 90, temperature 98.5 F (36.9 C), temperature source Oral, height 5' 3.5" (1.613 m), weight 128 lb 4 oz (58.2 kg).   Review of Systems  Constitutional: Negative for fatigue and fever.  HENT: Negative for congestion.   Eyes: Negative for pain.  Respiratory: Negative for cough and shortness of breath.   Cardiovascular: Negative for chest pain, palpitations and leg swelling.  Gastrointestinal: Positive for abdominal pain. Negative for constipation and rectal pain.  Genitourinary: Negative for dysuria and vaginal bleeding.  Musculoskeletal: Negative for back pain.  Neurological: Negative for syncope, light-headedness and headaches.  Psychiatric/Behavioral: Negative for dysphoric mood.       Objective:   Physical Exam  Constitutional: Vital signs are normal. She appears well-developed and well-nourished. She is cooperative.  Non-toxic appearance. She does not appear ill. No distress.  HENT:  Head: Normocephalic.  Right Ear: Hearing, tympanic membrane, external ear and ear canal  normal. Tympanic membrane is not erythematous, not retracted and not bulging.  Left Ear: Hearing, tympanic membrane, external ear and ear canal normal. Tympanic membrane is not erythematous, not retracted and not bulging.  Nose: No mucosal edema or rhinorrhea. Right sinus exhibits no maxillary sinus tenderness and no frontal sinus tenderness. Left sinus exhibits no maxillary sinus tenderness and no frontal sinus tenderness.  Mouth/Throat: Uvula is midline, oropharynx is clear and moist and mucous membranes are normal.  Eyes: Pupils are equal, round, and reactive to light. Conjunctivae, EOM and lids are normal. Lids are everted and swept, no foreign bodies found.  Neck: Trachea normal and normal range of motion. Neck supple. Carotid bruit is not present. No thyroid mass and no thyromegaly present.  Cardiovascular: Normal rate, regular rhythm, S1 normal, S2 normal, normal heart sounds, intact distal pulses and normal pulses. Exam reveals no gallop and no friction rub.  No murmur heard. Pulmonary/Chest: Effort normal and breath sounds normal. No tachypnea. No respiratory distress. She has no decreased breath sounds. She has no wheezes. She has no rhonchi. She has no rales.  Abdominal: Soft. Normal appearance and bowel sounds are normal. She exhibits no distension and no mass. There is tenderness in the epigastric area. There is no rigidity, no rebound, no guarding, no CVA tenderness and no tenderness at McBurney's point. No hernia.   Only mildly ttp in epigastrum  Neurological: She is alert.  Skin: Skin is warm, dry and  intact. No rash noted.  Psychiatric: Her speech is normal and behavior is normal. Judgment and thought content normal. Her mood appears not anxious. Cognition and memory are normal. She does not exhibit a depressed mood.          Assessment & Plan:

## 2017-07-31 NOTE — Telephone Encounter (Signed)
Pt called to check status

## 2017-07-31 NOTE — Telephone Encounter (Signed)
I spoke with pt; vomiting and diarrhea were black blood on 07/28/17; then changed to bright red blood on 07/29/17 -07/30/17. Today pt has black tarry diarrhea; small amt of vomiting this morning but no blood in vomitus today. No abd pain and no fever. Pt could not stay at ED due to taking care of her parents. Advised pt she would need to be seen. Dr Diona Browner had 30' appt today at 3 PM. Pt scheduled appt. Pt wanted Dr Damita Dunnings to be aware of her dark stools. FYI to Dr Damita Dunnings and Dr Diona Browner.

## 2017-07-31 NOTE — Assessment & Plan Note (Signed)
Symptoms reflect likely upper GI with now improved brisk bleeding... Returned to melena and no current hemtemesis.   Pt nontoxic and stable vitals.  Start on PPI BID and urgent referral to GI for likely endoscopy, but pt instructed if brbpr returns.. Go back to ER.   Will re-eval today to assess if hg lower ( was nml in ER)

## 2017-07-31 NOTE — Patient Instructions (Addendum)
Start protonix 40 mg twice daily.   Please stop at the lab to have labs drawn. Please stop at the front desk to set up referral.  Go to ER if bright red blood in stool or vomiting recurs.  Increase fluids and bland diet.. No acidic foods.  No alcohol.  Quit smoking.

## 2017-07-31 NOTE — Telephone Encounter (Signed)
Copied from [place] [id number]. 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 Damita Dunnings review her l Labs to see what needs to be done next   [phone]

## 2017-07-31 NOTE — Telephone Encounter (Signed)
Pt lab results from ED visit on 07/30/17 are in pts chart under lab tab. These were pts vitals at ED.  First Filed Value  BP 165/96Abnormal  filed time 07/30/2017 1308  Pulse Rate 96 filed time 07/30/2017 1308  Resp 15 filed time 07/30/2017 1308  SpO2 100 % filed time 07/30/2017 1308  O2 Flow Rate (L/min) -  Temp 99 F (37.2 C) filed time 07/30/2017 1308  Temp Source Oral filed time 07/30/2017 1308

## 2017-07-31 NOTE — Telephone Encounter (Signed)
Dr Diona Browner said pt needed to be seen and that would be OK to schedule with her.

## 2017-08-04 ENCOUNTER — Other Ambulatory Visit: Payer: Self-pay

## 2017-08-04 ENCOUNTER — Ambulatory Visit: Payer: BLUE CROSS/BLUE SHIELD | Admitting: Gastroenterology

## 2017-08-04 ENCOUNTER — Encounter: Payer: Self-pay | Admitting: Gastroenterology

## 2017-08-04 VITALS — BP 156/89 | HR 92 | Ht 63.5 in | Wt 128.4 lb

## 2017-08-04 DIAGNOSIS — K920 Hematemesis: Secondary | ICD-10-CM | POA: Diagnosis not present

## 2017-08-04 DIAGNOSIS — K921 Melena: Secondary | ICD-10-CM | POA: Diagnosis not present

## 2017-08-04 NOTE — Progress Notes (Signed)
Gastroenterology Consultation  Referring Provider:     Jinny Sanders, MD Primary Care Physician:  Tonia Ghent, MD Primary Gastroenterologist:  Dr. Allen Norris     Reason for Consultation:     Rectal bleeding and hematemesis        HPI:   Krystal Mercado is a 47 y.o. y/o female referred for consultation & management of Rectal bleeding and hem by Dr. Damita Dunnings, Elveria Rising, MD.  This patient comes today after having a report of hematemesis with bright red blood per rectum.  The patient also states that she also had black stools that was a tar-like.  The patient reports that she has a history of having coffee-ground emesis many years ago when she had her gallbladder out.  The patient does not take any Advil Aleve Motrin BCs or Goody powders.  The patient denies any abdominal pain during these episodes except some cramping when she moves her bowels.  The patient also states that she has had increase GI problems over the last year with increased bloating.  The patient has been under increased stress with a father who has renal cell cancer and a mother who is been sick.  The patient states she takes care of both these parents.  There is no report of any other change in bowel habits.  The patient did have her blood count checked when she was bleeding and was found to have a normal hemoglobin and hematocrit.  The patient states that she usually runs high because of her smoking. On the 16th of this month the patient had a comprehensive metabolic panel that showed her to have a sodium slightly low at 134 with creatinine elevated at 1.16 and her AST slightly elevated at 44.  The patient denies alcohol abuse and states that she drinks usually 1 to 2 glasses of wine a night.  The following day the patient had a repeat of her comprehensive metabolic panel with everything returning to normal except for the sodium which was still 134. The patient states she was started on Protonix twice a day and her stools have turned  back to brown and she is no longer having any sign of bleeding.  Past Medical History:  Diagnosis Date  . Allergy 2003   allergic rhinitis  . Anxiety   . Observation or evaluation for suspected condition 04/28/2007   heme Evaluation no known pathology (Dr.Metjian, [facility])  . Skin necrosis (HCC)    left thumb- [facility]    Past Surgical History:  Procedure Laterality Date  . ABDOMINAL HYSTERECTOMY    . CHOLECYSTECTOMY  1995   conization, BTL  . CYSTECTOMY  1990   cyst in fallopian tube removed, Tube intact  . GROIN DISSECTION  05/2000   Right groin Lymph node dissection for cat scratch Disease  . MRI  07/2004   negative except sinusitis  . Thoracic aortogram  09/23/2006   Thoracic aortogram with select LUE arteriogram small radial artery below the brach bifurcation  . TUBAL LIGATION  1995   conization, BTL; choleycystectomy  . VAGINAL DELIVERY     X's 2    Prior to Admission medications   Medication Sig Start Date End Date Taking? Authorizing Provider  Multiple Vitamin (MULTIVITAMIN) tablet Take 1 tablet by mouth daily.   Yes [provider]  pantoprazole (PROTONIX) 40 MG tablet Take 1 tablet (40 mg total) by mouth 2 (two) times daily. 07/31/17  Yes Jinny Sanders, MD  sertraline (  ZOLOFT) 100 MG tablet TAKE 1 TABLET BY MOUTH EVERY DAY--NEED TO BE SEEN FOR FURTHER REFILLS 06/10/17  Yes Tonia Ghent, MD    Family History  Problem Relation Age of Onset  . Cancer Mother 1       breast cancer/lymphoma disease 10/2004  . Hypertension Father   . Stroke Father   . Alcohol abuse Brother   . Cancer Maternal Grandmother        bone cancer //young in 30's  . Alcohol abuse Maternal Grandmother   . Stroke Maternal Grandmother   . Cancer Maternal Grandfather        ? bone cancer./ prostate/colon cancer     Social History   Tobacco Use  . Smoking status: Current Every Day Smoker    Packs/day: 1.00    Years: 25.00    Pack years: 25.00    Types:  Cigarettes  . Smokeless tobacco: Never Used  Substance Use Topics  . Alcohol use: Yes    Comment: wine at night  . Drug use: No    Allergies as of 08/04/2017  . (No Known Allergies)    Review of Systems:    All systems reviewed and negative except where noted in HPI.   Physical Exam:  BP (!) 156/89   Pulse 92   Ht 5' 3.5" (1.613 m)   Wt 128 lb 6.4 oz (58.2 kg)   BMI 22.39 kg/m  No LMP recorded. Patient has had a hysterectomy. Psych:  Alert and cooperative. Normal mood and affect. General:   Alert,  Well-developed, well-nourished, pleasant and cooperative in NAD Head:  Normocephalic and atraumatic. Eyes:  Sclera clear, no icterus.   Conjunctiva pink. Ears:  Normal auditory acuity. Nose:  No deformity, discharge, or lesions. Mouth:  No deformity or lesions,oropharynx pink & moist. Neck:  Supple; no masses or thyromegaly. Lungs:  Respirations even and unlabored.  Clear throughout to auscultation.   No wheezes, crackles, or rhonchi. No acute distress. Heart:  Regular rate and rhythm; no murmurs, clicks, rubs, or gallops. Abdomen:  Normal bowel sounds.  No bruits.  Soft, non-tender and non-distended without masses, hepatosplenomegaly or hernias noted.  No guarding or rebound tenderness.  Negative Carnett sign.   Rectal:  Deferred.  Msk:  Symmetrical without gross deformities.  Good, equal movement & strength bilaterally. Pulses:  Normal pulses noted. Extremities:  No clubbing or edema.  No cyanosis. Neurologic:  Alert and oriented x3;  grossly normal neurologically. Skin:  Intact without significant lesions or rashes.  No jaundice. Lymph Nodes:  No significant cervical adenopathy. Psych:  Alert and cooperative. Normal mood and affect.  Imaging Studies: No results found.  Assessment and Plan:   Krystal Mercado is a 46 y.o. y/o female who comes in with a history of vomiting bright red blood with passage of bright red blood per rectum that also was accompanied by black tarry  stools.  The patient was put on a PPI twice a day and has had no further symptoms.  Due to the patient's year-long dyspepsia with bloating and abdominal discomfort and her reports of intermittent diarrhea she will be set up for colonoscopy and an upper endoscopy because of her recent GI bleeding.  The patient has been to stay on her PPI.  The patient has been explained the plan and agrees with it.  Lucilla Lame, MD. [name]   Note: This dictation was prepared with Dragon dictation along with smaller phrase technology. Any transcriptional errors that result from this process  are unintentional.

## 2017-08-05 ENCOUNTER — Other Ambulatory Visit: Payer: Self-pay

## 2017-08-05 DIAGNOSIS — K921 Melena: Secondary | ICD-10-CM

## 2017-08-05 DIAGNOSIS — K920 Hematemesis: Secondary | ICD-10-CM

## 2017-08-06 ENCOUNTER — Other Ambulatory Visit: Payer: Self-pay

## 2017-08-12 NOTE — Discharge Instructions (Signed)
General Anesthesia, Adult, Care After °These instructions provide you with information about caring for yourself after your procedure. Your health care provider may also give you more specific instructions. Your treatment has been planned according to current medical practices, but problems sometimes occur. Call your health care provider if you have any problems or questions after your procedure. °What can I expect after the procedure? °After the procedure, it is common to have: °· Vomiting. °· A sore throat. °· Mental slowness. ° °It is common to feel: °· Nauseous. °· Cold or shivery. °· Sleepy. °· Tired. °· Sore or achy, even in parts of your body where you did not have surgery. ° °Follow these instructions at home: °For at least 24 hours after the procedure: °· Do not: °? Participate in activities where you could fall or become injured. °? Drive. °? Use heavy machinery. °? Drink alcohol. °? Take sleeping pills or medicines that cause drowsiness. °? Make important decisions or sign legal documents. °? Take care of children on your own. °· Rest. °Eating and drinking °· If you vomit, drink water, juice, or soup when you can drink without vomiting. °· Drink enough fluid to keep your urine clear or pale yellow. °· Make sure you have little or no nausea before eating solid foods. °· Follow the diet recommended by your health care provider. °General instructions °· Have a responsible adult stay with you until you are awake and alert. °· Return to your normal activities as told by your health care provider. Ask your health care provider what activities are safe for you. °· Take over-the-counter and prescription medicines only as told by your health care provider. °· If you smoke, do not smoke without supervision. °· Keep all follow-up visits as told by your health care provider. This is important. °Contact a health care provider if: °· You continue to have nausea or vomiting at home, and medicines are not helpful. °· You  cannot drink fluids or start eating again. °· You cannot urinate after 8-12 hours. °· You develop a skin rash. °· You have fever. °· You have increasing redness at the site of your procedure. °Get help right away if: °· You have difficulty breathing. °· You have chest pain. °· You have unexpected bleeding. °· You feel that you are having a life-threatening or urgent problem. °This information is not intended to replace advice given to you by your health care provider. Make sure you discuss any questions you have with your health care provider. °Document Released: 06/09/2000 Document Revised: 08/06/2015 Document Reviewed: 02/15/2015 °Elsevier Interactive Patient Education © 2018 [employer] ° °

## 2017-08-13 ENCOUNTER — Encounter
Admission: RE | Disposition: A | Discharge status: Home / Self Care | Payer: Self-pay | Source: Ambulatory Visit | Attending: Gastroenterology

## 2017-08-13 ENCOUNTER — Ambulatory Visit
Admission: RE | Admit: 2017-08-13 | Discharge: 2017-08-13 | Disposition: A | Discharge status: Home / Self Care | Payer: BLUE CROSS/BLUE SHIELD | Source: Ambulatory Visit | Attending: Gastroenterology | Admitting: Gastroenterology

## 2017-08-13 ENCOUNTER — Ambulatory Visit: Payer: BLUE CROSS/BLUE SHIELD | Admitting: Anesthesiology

## 2017-08-13 DIAGNOSIS — K228 Other specified diseases of esophagus: Secondary | ICD-10-CM | POA: Diagnosis not present

## 2017-08-13 DIAGNOSIS — K641 Second degree hemorrhoids: Secondary | ICD-10-CM | POA: Diagnosis not present

## 2017-08-13 DIAGNOSIS — K2270 Barrett's esophagus without dysplasia: Secondary | ICD-10-CM | POA: Diagnosis not present

## 2017-08-13 DIAGNOSIS — Z86718 Personal history of other venous thrombosis and embolism: Secondary | ICD-10-CM | POA: Diagnosis not present

## 2017-08-13 DIAGNOSIS — Z79899 Other long term (current) drug therapy: Secondary | ICD-10-CM | POA: Insufficient documentation

## 2017-08-13 DIAGNOSIS — F419 Anxiety disorder, unspecified: Secondary | ICD-10-CM | POA: Diagnosis not present

## 2017-08-13 DIAGNOSIS — D123 Benign neoplasm of transverse colon: Secondary | ICD-10-CM | POA: Insufficient documentation

## 2017-08-13 DIAGNOSIS — I7300 Raynaud's syndrome without gangrene: Secondary | ICD-10-CM | POA: Diagnosis not present

## 2017-08-13 DIAGNOSIS — K219 Gastro-esophageal reflux disease without esophagitis: Secondary | ICD-10-CM | POA: Diagnosis not present

## 2017-08-13 DIAGNOSIS — F17210 Nicotine dependence, cigarettes, uncomplicated: Secondary | ICD-10-CM | POA: Insufficient documentation

## 2017-08-13 DIAGNOSIS — K921 Melena: Secondary | ICD-10-CM | POA: Diagnosis not present

## 2017-08-13 DIAGNOSIS — K920 Hematemesis: Secondary | ICD-10-CM | POA: Insufficient documentation

## 2017-08-13 HISTORY — PX: COLONOSCOPY WITH PROPOFOL: SHX5780

## 2017-08-13 HISTORY — DX: Gastro-esophageal reflux disease without esophagitis: K21.9

## 2017-08-13 HISTORY — DX: Raynaud's syndrome without gangrene: I73.00

## 2017-08-13 HISTORY — PX: POLYPECTOMY: SHX5525

## 2017-08-13 HISTORY — DX: Acute embolism and thrombosis of unspecified vein: I82.90

## 2017-08-13 HISTORY — PX: ESOPHAGOGASTRODUODENOSCOPY (EGD) WITH PROPOFOL: SHX5813

## 2017-08-13 SURGERY — COLONOSCOPY WITH PROPOFOL
Anesthesia: General

## 2017-08-13 MED ORDER — PROPOFOL 10 MG/ML IV BOLUS
INTRAVENOUS | Status: DC | PRN
  Administered 2017-08-13: 60 mg via INTRAVENOUS
  Administered 2017-08-13 (×2): 30 mg via INTRAVENOUS
  Administered 2017-08-13 (×2): 60 mg via INTRAVENOUS
  Administered 2017-08-13: 20 mg via INTRAVENOUS
  Administered 2017-08-13 (×2): 70 mg via INTRAVENOUS
  Administered 2017-08-13 (×2): 60 mg via INTRAVENOUS
  Administered 2017-08-13: 20 mg via INTRAVENOUS
  Administered 2017-08-13: 60 mg via INTRAVENOUS
  Administered 2017-08-13: 30 mg via INTRAVENOUS
  Administered 2017-08-13 (×2): 20 mg via INTRAVENOUS

## 2017-08-13 MED ORDER — STERILE WATER FOR IRRIGATION IR SOLN
Status: DC | PRN
  Administered 2017-08-13: 10:00:00

## 2017-08-13 MED ORDER — ACETAMINOPHEN 325 MG PO TABS: 650.0000 mg | ORAL_TABLET | Freq: Once | ORAL | Status: DC | PRN

## 2017-08-13 MED ORDER — ACETAMINOPHEN 160 MG/5ML PO SOLN: 325.0000 mg | ORAL | Status: DC | PRN

## 2017-08-13 MED ORDER — LIDOCAINE HCL (CARDIAC) PF 100 MG/5ML IV SOSY
PREFILLED_SYRINGE | INTRAVENOUS | Status: DC | PRN
  Administered 2017-08-13: 20 mg via INTRAVENOUS

## 2017-08-13 MED ORDER — LACTATED RINGERS IV SOLN
INTRAVENOUS | Status: DC | PRN
  Administered 2017-08-13 (×2): via INTRAVENOUS

## 2017-08-13 MED ORDER — ONDANSETRON HCL 4 MG/2ML IJ SOLN: 4.0000 mg | Freq: Once | INTRAMUSCULAR | Status: DC | PRN

## 2017-08-13 MED ORDER — GLYCOPYRROLATE 0.2 MG/ML IJ SOLN
INTRAMUSCULAR | Status: DC | PRN
  Administered 2017-08-13: 0.1 mg via INTRAVENOUS

## 2017-08-13 SURGICAL SUPPLY — 36 items
BALLN DILATOR 10-12 8 (BALLOONS)
BALLN DILATOR 12-15 8 (BALLOONS)
BALLN DILATOR 15-18 8 (BALLOONS)
BALLN DILATOR CRE 0-12 8 (BALLOONS)
BALLN DILATOR ESOPH 8 10 CRE (MISCELLANEOUS) IMPLANT
BALLOON DILATOR 12-15 8 (BALLOONS) IMPLANT
BALLOON DILATOR 15-18 8 (BALLOONS) IMPLANT
BALLOON DILATOR CRE 0-12 8 (BALLOONS) IMPLANT
BLOCK BITE 60FR ADLT L/F GRN (MISCELLANEOUS) ×2 IMPLANT
CANISTER SUCT 1200ML W/VALVE (MISCELLANEOUS) ×2 IMPLANT
CLIP HMST 235XBRD CATH ROT (MISCELLANEOUS) IMPLANT
CLIP RESOLUTION 360 11X235 (MISCELLANEOUS)
ELECT REM PT RETURN 9FT ADLT (ELECTROSURGICAL)
ELECTRODE REM PT RTRN 9FT ADLT (ELECTROSURGICAL) IMPLANT
FCP ESCP3.2XJMB 240X2.8X (MISCELLANEOUS)
FORCEPS BIOP RAD 4 LRG CAP 4 (CUTTING FORCEPS) ×1 IMPLANT
FORCEPS BIOP RJ4 240 W/NDL (MISCELLANEOUS)
FORCEPS ESCP3.2XJMB 240X2.8X (MISCELLANEOUS) IMPLANT
GOWN CVR UNV OPN BCK APRN NK (MISCELLANEOUS) ×2 IMPLANT
GOWN ISOL THUMB LOOP REG UNIV (MISCELLANEOUS) ×4
INJECTOR VARIJECT VIN23 (MISCELLANEOUS) IMPLANT
KIT DEFENDO VALVE AND CONN (KITS) IMPLANT
KIT ENDO PROCEDURE OLY (KITS) ×2 IMPLANT
MARKER SPOT ENDO TATTOO 5ML (MISCELLANEOUS) IMPLANT
PROBE APC STR FIRE (PROBE) IMPLANT
RETRIEVER NET PLAT FOOD (MISCELLANEOUS) IMPLANT
RETRIEVER NET ROTH 2.5X230 LF (MISCELLANEOUS) IMPLANT
SNARE SHORT THROW 13M SML OVAL (MISCELLANEOUS) IMPLANT
SNARE SHORT THROW 30M LRG OVAL (MISCELLANEOUS) IMPLANT
SNARE SNG USE RND 15MM (INSTRUMENTS) IMPLANT
SPOT EX ENDOSCOPIC TATTOO (MISCELLANEOUS)
SYR INFLATION 60ML (SYRINGE) IMPLANT
TRAP ETRAP POLY (MISCELLANEOUS) IMPLANT
VARIJECT INJECTOR VIN23 (MISCELLANEOUS)
WATER STERILE IRR 250ML POUR (IV SOLUTION) ×2 IMPLANT
WIRE CRE 18-20MM 8CM F G (MISCELLANEOUS) IMPLANT

## 2017-08-13 NOTE — H&P (Signed)
Lucilla Lame, MD [address] [place], [place] [address] Phone:[phone] Fax : [phone]  Primary Care Physician:  Tonia Ghent, MD Primary Gastroenterologist:  Dr. Allen Norris  Pre-Procedure History & Physical: HPI:  Krystal Mercado is a 47 y.o. female is here for an endoscopy and colonoscopy.   Past Medical History:  Diagnosis Date  . Allergy 2003   allergic rhinitis  . Anxiety   . Blood clot in vein    in thumb, had part of thumb amputated, no follow up  . GERD (gastroesophageal reflux disease)   . Observation or evaluation for suspected condition 04/28/2007   heme Evaluation no known pathology (Dr.Metjian, [facility])  . Raynaud's disease   . Skin necrosis (HCC)    left thumb- vascular eval at [facility]    Past Surgical History:  Procedure Laterality Date  . ABDOMINAL HYSTERECTOMY    . CHOLECYSTECTOMY  1995   conization, BTL  . CYSTECTOMY  1990   cyst in fallopian tube removed, Tube intact  . GROIN DISSECTION  05/2000   Right groin Lymph node dissection for cat scratch Disease  . MRI  07/2004   negative except sinusitis  . Thoracic aortogram  09/23/2006   Thoracic aortogram with select LUE arteriogram small radial artery below the brach bifurcation  . TUBAL LIGATION  1995   conization, BTL; choleycystectomy  . VAGINAL DELIVERY     X's 2    Prior to Admission medications   Medication Sig Start Date End Date Taking? Authorizing Provider  Multiple Vitamin (MULTIVITAMIN) tablet Take 1 tablet by mouth daily.   Yes [provider]  pantoprazole (PROTONIX) 40 MG tablet Take 1 tablet (40 mg total) by mouth 2 (two) times daily. 07/31/17  Yes Bedsole, Amy E, MD  sertraline (ZOLOFT) 100 MG tablet TAKE 1 TABLET BY MOUTH EVERY DAY--NEED TO BE SEEN FOR FURTHER REFILLS 06/10/17  Yes Tonia Ghent, MD    Allergies as of 08/05/2017  . (No Known Allergies)    Family History  Problem Relation Age of Onset  . Cancer Mother 28       breast  cancer/lymphoma disease 10/2004  . Hypertension Father   . Stroke Father   . Alcohol abuse Brother   . Cancer Maternal Grandmother        bone cancer //young in 30's  . Alcohol abuse Maternal Grandmother   . Stroke Maternal Grandmother   . Cancer Maternal Grandfather        ? bone cancer./ prostate/colon cancer    Social History   Socioeconomic History  . Marital status: Unknown    Spouse name: Not on file  . Number of children: Not on file  . Years of education: Not on file  . Highest education level: Not on file  Occupational History  . Not on file  Social Needs  . Financial resource strain: Not on file  . Food insecurity:    Worry: Not on file    Inability: Not on file  . Transportation needs:    Medical: Not on file    Non-medical: Not on file  Tobacco Use  . Smoking status: Current Every Day Smoker    Packs/day: 1.00    Years: 33.00    Pack years: 33.00    Types: Cigarettes  . Smokeless tobacco: Never Used  Substance and Sexual Activity  . Alcohol use: Yes    Alcohol/week: 4.2 oz    Types: 7 Glasses of wine per week  Comment: wine at night  . Drug use: No  . Sexual activity: Not on file  Lifestyle  . Physical activity:    Days per week: Not on file    Minutes per session: Not on file  . Stress: Not on file  Relationships  . Social connections:    Talks on phone: Not on file    Gets together: Not on file    Attends religious service: Not on file    Active member of club or organization: Not on file    Attends meetings of clubs or organizations: Not on file    Relationship status: Not on file  . Intimate partner violence:    Fear of current or ex partner: Not on file    Emotionally abused: Not on file    Physically abused: Not on file    Forced sexual activity: Not on file  Other Topics Concern  . Not on file  Social History Narrative   Living with her parents as of 2018.     Working at [employer] in [employer]     Divorced    Review of  Systems: See HPI, otherwise negative ROS  Physical Exam: BP (!) 145/98   Pulse 85   Temp (!) 97.5 F (36.4 C) (Temporal)   Ht 5\' 3"  (1.6 m)   Wt 126 lb (57.2 kg)   SpO2 100%   BMI 22.32 kg/m  General:   Alert,  pleasant and cooperative in NAD Head:  Normocephalic and atraumatic. Neck:  Supple; no masses or thyromegaly. Lungs:  Clear throughout to auscultation.    Heart:  Regular rate and rhythm. Abdomen:  Soft, nontender and nondistended. Normal bowel sounds, without guarding, and without rebound.   Neurologic:  Alert and  oriented x4;  grossly normal neurologically.  Impression/Plan: Krystal Mercado is here for an endoscopy and colonoscopy to be performed for hematochezia and hemetemesis  Risks, benefits, limitations, and alternatives regarding  endoscopy and colonoscopy have been reviewed with the patient.  Questions have been answered.  All parties agreeable.   Lucilla Lame, MD  08/13/2017, 9:33 AM

## 2017-08-13 NOTE — Anesthesia Procedure Notes (Signed)
Procedure Name: MAC Date/Time: 08/13/2017 10:18 AM Performed by: Lind Guest, CRNA Pre-anesthesia Checklist: Patient identified, Emergency Drugs available, Suction available, Patient being monitored and Timeout performed Patient Re-evaluated:Patient Re-evaluated prior to induction Oxygen Delivery Method: Nasal cannula

## 2017-08-13 NOTE — Anesthesia Preprocedure Evaluation (Signed)
Anesthesia Evaluation  Patient identified by MRN, date of birth, ID band Patient awake    Reviewed: Allergy & Precautions, NPO status , Patient's Chart, lab work & pertinent test results  History of Anesthesia Complications Negative for: history of anesthetic complications  Airway Mallampati: III  TM Distance: >3 FB Neck ROM: Full    Dental  (+)    Pulmonary Current Smoker (1 ppd),    Pulmonary exam normal breath sounds clear to auscultation       Cardiovascular Exercise Tolerance: Good negative cardio ROS Normal cardiovascular exam Rhythm:Regular Rate:Normal     Neuro/Psych PSYCHIATRIC DISORDERS Anxiety negative neurological ROS     GI/Hepatic GERD  ,  Endo/Other  negative endocrine ROS  Renal/GU negative Renal ROS     Musculoskeletal   Abdominal   Peds  Hematology Raynaud's; hx clot in thumb   Anesthesia Other Findings   Reproductive/Obstetrics                             Anesthesia Physical Anesthesia Plan  ASA: II  Anesthesia Plan: General   Post-op Pain Management:    Induction: Intravenous  PONV Risk Score and Plan: 2 and Propofol infusion and TIVA  Airway Management Planned: Natural Airway  Additional Equipment:   Intra-op Plan:   Post-operative Plan:   Informed Consent: I have reviewed the patients History and Physical, chart, labs and discussed the procedure including the risks, benefits and alternatives for the proposed anesthesia with the patient or authorized representative who has indicated his/her understanding and acceptance.     Plan Discussed with: CRNA  Anesthesia Plan Comments:         Anesthesia Quick Evaluation

## 2017-08-13 NOTE — Op Note (Signed)
[facility] Gastroenterology Patient Name: Krystal Mercado Procedure Date: 08/13/2017 10:10 AM MRN: [id number] Account #: [id number] Date of Birth: February 28, 1971 Admit Type: Outpatient Age: 47 Room: [facility] OR ROOM 01 Gender: Female Note Status: Finalized Procedure:            Colonoscopy Indications:          Hematochezia Providers:            Lucilla Lame MD, MD Medicines:            Propofol per Anesthesia Complications:        No immediate complications. Procedure:            Pre-Anesthesia Assessment:                       - Prior to the procedure, a History and Physical was                        performed, and patient medications and allergies were                        reviewed. The patient's tolerance of previous                        anesthesia was also reviewed. The risks and benefits of                        the procedure and the sedation options and risks were                        discussed with the patient. All questions were                        answered, and informed consent was obtained. Prior                        Anticoagulants: The patient has taken no previous                        anticoagulant or antiplatelet agents. ASA Grade                        Assessment: II - A patient with mild systemic disease.                        After reviewing the risks and benefits, the patient was                        deemed in satisfactory condition to undergo the                        procedure.                       After obtaining informed consent, the colonoscope was                        passed under direct vision. Throughout the procedure,                        the patient's blood pressure, pulse, and oxygen  saturations were monitored continuously. The [place] [id number]) was introduced through the                        anus and advanced to the the cecum, identified by             appendiceal orifice and ileocecal valve. The                        colonoscopy was performed without difficulty. The                        patient tolerated the procedure well. The quality of                        the bowel preparation was excellent. Findings:      The perianal and digital rectal examinations were normal.      Two sessile polyps were found in the transverse colon. The polyps were 2       to 3 mm in size. These polyps were removed with a cold biopsy forceps.       Resection and retrieval were complete.      A 7 mm polyp was found in the transverse colon. The polyp was sessile.       The polyp was removed with a cold snare. Resection and retrieval were       complete.      Non-bleeding internal hemorrhoids were found during retroflexion. The       hemorrhoids were Grade II (internal hemorrhoids that prolapse but reduce       spontaneously). Impression:           - Two 2 to 3 mm polyps in the transverse colon, removed                        with a cold biopsy forceps. Resected and retrieved.                       - One 7 mm polyp in the transverse colon, removed with                        a cold snare. Resected and retrieved.                       - Non-bleeding internal hemorrhoids. Recommendation:       - Discharge patient to home.                       - Resume previous diet.                       - Continue present medications.                       - Await pathology results.                       - Repeat colonoscopy in 5 years if polyp adenoma and 10  years if hyperplastic Procedure Code(s):    --- Professional ---                       [phone], Colonoscopy, flexible; with removal of tumor(s),                        polyp(s), or other lesion(s) by snare technique                       [id number], 59, Colonoscopy, flexible; with biopsy, single                        or multiple Diagnosis Code(s):    --- Professional ---                        K92.1, Melena (includes Hematochezia)                       D12.3, Benign neoplasm of transverse colon (hepatic                        flexure or splenic flexure) CPT copyright 2017 American Medical Association. All rights reserved. The codes documented in this report are preliminary and upon coder review may  be revised to meet current compliance requirements. Lucilla Lame MD, MD 08/13/2017 10:49:16 AM This report has been signed electronically. Number of Addenda: 0 Note Initiated On: 08/13/2017 10:10 AM Scope Withdrawal Time: 0 hours 7 minutes 59 seconds  Total Procedure Duration: 0 hours 11 minutes 6 seconds       [facility]

## 2017-08-13 NOTE — Anesthesia Postprocedure Evaluation (Signed)
Anesthesia Post Note  Patient: SHEIKA COUTTS  Procedure(s) Performed: COLONOSCOPY WITH PROPOFOL (N/A ) ESOPHAGOGASTRODUODENOSCOPY (EGD) WITH PROPOFOL (N/A ) POLYPECTOMY (N/A )  Patient location during evaluation: PACU Anesthesia Type: General Level of consciousness: awake and alert, oriented and patient cooperative Pain management: pain level controlled Vital Signs Assessment: post-procedure vital signs reviewed and stable Respiratory status: spontaneous breathing, nonlabored ventilation and respiratory function stable Cardiovascular status: blood pressure returned to baseline and stable Postop Assessment: adequate PO intake Anesthetic complications: no    Darrin Nipper

## 2017-08-13 NOTE — Op Note (Signed)
[facility] Gastroenterology Patient Name: Krystal Mercado Procedure Date: 08/13/2017 10:10 AM MRN: [id number] Account #: [id number] Date of Birth: 1970/05/28 Admit Type: Outpatient Age: 47 Room: [facility] OR ROOM 01 Gender: Female Note Status: Finalized Procedure:            Upper GI endoscopy Indications:          Hematemesis Providers:            Lucilla Lame MD, MD Referring MD:         Elveria Rising. Damita Dunnings, MD (Referring MD) Medicines:            Propofol per Anesthesia Complications:        No immediate complications. Procedure:            Pre-Anesthesia Assessment:                       - Prior to the procedure, a History and Physical was                        performed, and patient medications and allergies were                        reviewed. The patient's tolerance of previous                        anesthesia was also reviewed. The risks and benefits of                        the procedure and the sedation options and risks were                        discussed with the patient. All questions were                        answered, and informed consent was obtained. Prior                        Anticoagulants: The patient has taken no previous                        anticoagulant or antiplatelet agents. ASA Grade                        Assessment: II - A patient with mild systemic disease.                        After reviewing the risks and benefits, the patient was                        deemed in satisfactory condition to undergo the                        procedure.                       After obtaining informed consent, the endoscope was                        passed under direct vision. Throughout the procedure,  the patient's blood pressure, pulse, and oxygen                        saturations were monitored continuously. The Olympus                        190 Endoscope [id number]) was introduced through the                         mouth, and advanced to the second part of duodenum. The                        upper GI endoscopy was accomplished without difficulty.                        The patient tolerated the procedure well. Findings:      There were esophageal mucosal changes suspicious for long-segment       Barrett's esophagus present in the lower third of the esophagus. The       maximum longitudinal extent of these mucosal changes was 8 cm in length.       Mucosa was biopsied with a cold forceps for histology in 4 quadrants at       intervals of 2 cm from 30 to 38 cm from the incisors.      The stomach was normal.      The examined duodenum was normal. Impression:           - Esophageal mucosal changes suspicious for                        long-segment Barrett's esophagus. Biopsied.                       - Normal stomach.                       - Normal examined duodenum. Recommendation:       - Discharge patient to home.                       - Resume previous diet.                       - Continue present medications.                       - Await pathology results.                       - Repeat upper endoscopy in 3 years for surveillance.                       - Perform a colonoscopy today. Procedure Code(s):    --- Professional ---                       [phone], Esophagogastroduodenoscopy, flexible, transoral;                        with biopsy, single or multiple Diagnosis Code(s):    --- Professional ---                       K92.0,  Hematemesis                       K22.8, Other specified diseases of esophagus CPT copyright 2017 American Medical Association. All rights reserved. The codes documented in this report are preliminary and upon coder review may  be revised to meet current compliance requirements. Lucilla Lame MD, MD 08/13/2017 10:33:51 AM This report has been signed electronically. Number of Addenda: 0 Note Initiated On: 08/13/2017 10:10 AM      [facility]

## 2017-08-13 NOTE — Transfer of Care (Signed)
Immediate Anesthesia Transfer of Care Note  Patient: Krystal Mercado  Procedure(s) Performed: COLONOSCOPY WITH PROPOFOL (N/A ) ESOPHAGOGASTRODUODENOSCOPY (EGD) WITH PROPOFOL (N/A ) POLYPECTOMY (N/A )  Patient Location: PACU  Anesthesia Type: General  Level of Consciousness: awake, alert  and patient cooperative  Airway and Oxygen Therapy: Patient Spontanous Breathing and Patient connected to supplemental oxygen  Post-op Assessment: Post-op Vital signs reviewed, Patient's Cardiovascular Status Stable, Respiratory Function Stable, Patent Airway and No signs of Nausea or vomiting  Post-op Vital Signs: Reviewed and stable  Complications: No apparent anesthesia complications

## 2017-08-14 ENCOUNTER — Encounter: Payer: Self-pay | Admitting: Gastroenterology

## 2017-08-18 ENCOUNTER — Other Ambulatory Visit: Payer: Self-pay

## 2017-08-20 ENCOUNTER — Telehealth: Payer: Self-pay

## 2017-08-20 NOTE — Telephone Encounter (Signed)
-----   Message from Lucilla Lame, MD sent at 08/17/2017  1:40 PM EDT ----- Please have the patient come in for a follow up.

## 2017-08-20 NOTE — Telephone Encounter (Signed)
Pt scheduled for a follow up appt to discuss procedure results.

## 2017-08-24 ENCOUNTER — Other Ambulatory Visit: Payer: Self-pay | Admitting: Family Medicine

## 2017-08-24 DIAGNOSIS — R7989 Other specified abnormal findings of blood chemistry: Secondary | ICD-10-CM

## 2017-08-26 ENCOUNTER — Other Ambulatory Visit (INDEPENDENT_AMBULATORY_CARE_PROVIDER_SITE_OTHER): Payer: BLUE CROSS/BLUE SHIELD

## 2017-08-26 ENCOUNTER — Ambulatory Visit: Payer: BLUE CROSS/BLUE SHIELD | Admitting: Gastroenterology

## 2017-08-26 ENCOUNTER — Encounter: Payer: Self-pay | Admitting: Gastroenterology

## 2017-08-26 VITALS — BP 142/83 | HR 94 | Ht 63.5 in | Wt 128.0 lb

## 2017-08-26 DIAGNOSIS — R7989 Other specified abnormal findings of blood chemistry: Secondary | ICD-10-CM | POA: Diagnosis not present

## 2017-08-26 DIAGNOSIS — K2270 Barrett's esophagus without dysplasia: Secondary | ICD-10-CM | POA: Diagnosis not present

## 2017-08-26 LAB — CBC WITH DIFFERENTIAL/PLATELET
BASOS ABS: 0.1 10*3/uL (ref 0.0–0.1)
Basophils Relative: 1.3 % (ref 0.0–3.0)
Eosinophils Absolute: 0.3 10*3/uL (ref 0.0–0.7)
Eosinophils Relative: 3.1 % (ref 0.0–5.0)
HEMATOCRIT: 42.3 % (ref 36.0–46.0)
HEMOGLOBIN: 14.6 g/dL (ref 12.0–15.0)
LYMPHS PCT: 39.9 % (ref 12.0–46.0)
Lymphs Abs: 3.9 10*3/uL (ref 0.7–4.0)
MCHC: 34.6 g/dL (ref 30.0–36.0)
MCV: 99.2 fl (ref 78.0–100.0)
Monocytes Absolute: 0.6 10*3/uL (ref 0.1–1.0)
Monocytes Relative: 6.2 % (ref 3.0–12.0)
NEUTROS ABS: 4.8 10*3/uL (ref 1.4–7.7)
Neutrophils Relative %: 49.5 % (ref 43.0–77.0)
PLATELETS: 318 10*3/uL (ref 150.0–400.0)
RBC: 4.26 Mil/uL (ref 3.87–5.11)
RDW: 13.5 % (ref 11.5–15.5)
WBC: 9.8 10*3/uL (ref 4.0–10.5)

## 2017-08-26 MED ORDER — PANTOPRAZOLE SODIUM 40 MG PO TBEC: 40.0000 mg | DELAYED_RELEASE_TABLET | Freq: Every day | ORAL | 11 refills | Status: DC

## 2017-08-26 NOTE — Progress Notes (Signed)
    Primary Care Physician: Tonia Ghent, MD  Primary Gastroenterologist:  Dr. Lucilla Lame  Chief Complaint  Patient presents with  . Follow up procedure results    HPI: Krystal Mercado is a 47 y.o. female here who comes in for follow-up today after having an upper endoscopy.  The patient was found to have findings consistent with long segment Barrett's.  The patient had approximate 10 cm of abnormal tissue in the distal esophagus.  These areas were biopsied and was shown to have intestinal metaplasia without any dysplasia.  The patient also had 3 adenomatous polyps.  Current Outpatient Medications  Medication Sig Dispense Refill  . Multiple Vitamin (MULTIVITAMIN) tablet Take 1 tablet by mouth daily.    . pantoprazole (PROTONIX) 40 MG tablet Take 1 tablet (40 mg total) by mouth 2 (two) times daily. 60 tablet 0  . sertraline (ZOLOFT) 100 MG tablet TAKE 1 TABLET BY MOUTH EVERY DAY--NEED TO BE SEEN FOR FURTHER REFILLS 30 tablet 5   No current facility-administered medications for this visit.     Allergies as of 08/26/2017  . (No Known Allergies)    ROS:  General: Negative for anorexia, weight loss, fever, chills, fatigue, weakness. ENT: Negative for hoarseness, difficulty swallowing , nasal congestion. CV: Negative for chest pain, angina, palpitations, dyspnea on exertion, peripheral edema.  Respiratory: Negative for dyspnea at rest, dyspnea on exertion, cough, sputum, wheezing.  GI: See history of present illness. GU:  Negative for dysuria, hematuria, urinary incontinence, urinary frequency, nocturnal urination.  Endo: Negative for unusual weight change.    Physical Examination:   BP (!) 142/83   Pulse 94   Ht 5' 3.5" (1.613 m)   Wt 128 lb (58.1 kg)   BMI 22.32 kg/m   General: Well-nourished, well-developed in no acute distress.  Eyes: No icterus. Conjunctivae pink. Extremities: No lower extremity edema. No clubbing or deformities. Neuro: Alert and oriented x 3.   Grossly intact. Skin: Warm and dry, no jaundice.   Psych: Alert and cooperative, normal mood and affect.  Labs:    Imaging Studies: No results found.  Assessment and Plan:   Krystal Mercado is a 47 y.o. y/o female who was found to have Barrett's esophagus without any dysplasia on her recent EGD.  The patient has been told that she will need a repeat EGD in 3 years.  The patient also had adenomatous polyps and will need a repeat colonoscopy in 5 years.  The patient has been explained the pathophysiology of Barrett's esophagus and will continue to take Protonix 40 mg once a day indefinitely.  The patient has been explained the plan and agrees with it.    Lucilla Lame, MD. [name]   Note: This dictation was prepared with Dragon dictation along with smaller phrase technology. Any transcriptional errors that result from this process are unintentional.

## 2017-08-28 ENCOUNTER — Other Ambulatory Visit: Payer: Self-pay | Admitting: Family Medicine

## 2017-10-05 ENCOUNTER — Ambulatory Visit: Payer: BLUE CROSS/BLUE SHIELD | Admitting: Internal Medicine

## 2017-10-05 ENCOUNTER — Encounter: Payer: Self-pay | Admitting: Internal Medicine

## 2017-10-05 VITALS — BP 130/90 | HR 94 | Temp 98.8°F | Resp 16 | Ht 63.3 in | Wt 126.0 lb

## 2017-10-05 DIAGNOSIS — R1031 Right lower quadrant pain: Secondary | ICD-10-CM | POA: Diagnosis not present

## 2017-10-05 MED ORDER — TRAMADOL HCL 50 MG PO TABS: 50.0000 mg | ORAL_TABLET | Freq: Three times a day (TID) | ORAL | 0 refills | Status: DC | PRN

## 2017-10-05 MED ORDER — KETOROLAC TROMETHAMINE 60 MG/2ML IM SOLN
30.0000 mg | Freq: Once | INTRAMUSCULAR | Status: AC
  Administered 2017-10-05: 30 mg via INTRAMUSCULAR

## 2017-10-05 NOTE — Progress Notes (Signed)
Subjective:    Patient ID: Krystal Mercado, female    DOB: 07-25-1970, 47 y.o.   MRN: [id number]  HPI  Pt presents to the clinic today with c/o right lower quadrant pain. This started yesterday. She noticed some pressure when she was at work. She describes the pais as sharp, cramping. The pain does not radiate. The pain is worse with movement. She denies nausea, vomiting, diarrhea, constipation or blood in her stool. She denies urgency, frequency, dysuria or blood in her urine. She denies vaginal discharge, irritation or odor. She denies any abdominal injury or lifting heavy objects. She has not taken anything OTC for her pain. She has had a cholecystectomy, appendectomy and hysterectomy in the past. She was recently started on Pantoprazole after an upper GI bleed. She had UGI and colonoscopy by Dr. Allen Norris 07/2017. 3 sessile polyps were removed and she was diagnosed with Barrett's Esophagus without dysplasia.  Review of Systems  Past Medical History:  Diagnosis Date  . Allergy 2003   allergic rhinitis  . Anxiety   . Blood clot in vein    in thumb, had part of thumb amputated, no follow up  . GERD (gastroesophageal reflux disease)   . Observation or evaluation for suspected condition 04/28/2007   heme Evaluation no known pathology (Dr.Metjian, [facility]on[facility])  . Raynaud's disease   . Skin necrosis (HCC)    left thumb- vascular eval at [facility]    Current Outpatient Medications  Medication Sig Dispense Refill  . Multiple Vitamin (MULTIVITAMIN) tablet Take 1 tablet by mouth daily.    . pantoprazole (PROTONIX) 40 MG tablet Take 1 tablet (40 mg total) by mouth daily. 30 tablet 11  . sertraline (ZOLOFT) 100 MG tablet TAKE 1 TABLET BY MOUTH EVERY DAY--NEED TO BE SEEN FOR FURTHER REFILLS 30 tablet 5   No current facility-administered medications for this visit.     No Known Allergies  Family History  Problem Relation Age of Onset  . Cancer Mother 63       breast cancer/lymphoma disease  10/2004  . Hypertension Father   . Stroke Father   . Alcohol abuse Brother   . Cancer Maternal Grandmother        bone cancer //young in 30's  . Alcohol abuse Maternal Grandmother   . Stroke Maternal Grandmother   . Cancer Maternal Grandfather        ? bone cancer./ prostate/colon cancer    Social History   Socioeconomic History  . Marital status: Unknown    Spouse name: Not on file  . Number of children: Not on file  . Years of education: Not on file  . Highest education level: Not on file  Occupational History  . Not on file  Social Needs  . Financial resource strain: Not on file  . Food insecurity:    Worry: Not on file    Inability: Not on file  . Transportation needs:    Medical: Not on file    Non-medical: Not on file  Tobacco Use  . Smoking status: Current Every Day Smoker    Packs/day: 1.00    Years: 33.00    Pack years: 33.00    Types: Cigarettes  . Smokeless tobacco: Never Used  Substance and Sexual Activity  . Alcohol use: Yes    Alcohol/week: 4.2 oz    Types: 7 Glasses of wine per week    Comment: wine at night  . Drug use: No  . Sexual activity: Not on file  Lifestyle  . Physical activity:    Days per week: Not on file    Minutes per session: Not on file  . Stress: Not on file  Relationships  . Social connections:    Talks on phone: Not on file    Gets together: Not on file    Attends religious service: Not on file    Active member of club or organization: Not on file    Attends meetings of clubs or organizations: Not on file    Relationship status: Not on file  . Intimate partner violence:    Fear of current or ex partner: Not on file    Emotionally abused: Not on file    Physically abused: Not on file    Forced sexual activity: Not on file  Other Topics Concern  . Not on file  Social History Narrative   Living with her parents as of 2018.     Working at [employer] in [employer]     Divorced     Constitutional: Denies fever,  malaise, fatigue, headache or abrupt weight changes.  Respiratory: Denies difficulty breathing, shortness of breath, cough or sputum production.   Cardiovascular: Denies chest pain, chest tightness, palpitations or swelling in the hands or feet.  Gastrointestinal: Pt reports abdominal pain. Denies bloating, constipation, diarrhea or blood in the stool.  GU: Denies urgency, frequency, pain with urination, burning sensation, blood in urine, odor or discharge.  Skin: Denies redness, rashes, lesions or ulcercations.   No other specific complaints in a complete review of systems (except as listed in HPI above).     Objective:   Physical Exam   BP 130/90 (BP Location: Right Arm, Patient Position: Sitting, Cuff Size: Small)   Pulse 94   Temp 98.8 F (37.1 C) (Oral)   Resp 16   Ht 5' 3.3" (1.608 m)   Wt 126 lb (57.2 kg)   SpO2 97%   BMI 22.11 kg/m  Wt Readings from Last 3 Encounters:  10/05/17 126 lb (57.2 kg)  08/26/17 128 lb (58.1 kg)  08/13/17 126 lb (57.2 kg)    General: Appears her stated age, appears in pain. Skin: Warm, dry and intact. No rashes, lesions or ulcerations noted. Cardiovascular: Normal rate and rhythm. S1,S2 noted.  No murmur, rubs or gallops noted. Pulmonary/Chest: Normal effort and positive vesicular breath sounds. No respiratory distress. No wheezes, rales or ronchi noted.  Abdomen: Soft and generally tender but worse in the RLQ. Normal bowel sounds. No distention or masses noted.  Neurological: Alert and oriented.    BMET    Component Value Date/Time   NA 134 (L) 07/31/2017 1550   NA 139 06/05/2011 1005   K 4.7 07/31/2017 1550   K 4.3 06/05/2011 1005   CL 100 07/31/2017 1550   CL 105 06/05/2011 1005   CO2 26 07/31/2017 1550   CO2 25 06/05/2011 1005   GLUCOSE 104 (H) 07/31/2017 1550   GLUCOSE 94 06/05/2011 1005   BUN 9 07/31/2017 1550   BUN 8 06/05/2011 1005   CREATININE 1.01 07/31/2017 1550   CALCIUM 9.9 07/31/2017 1550   CALCIUM 9.0 06/05/2011  1005   GFRNONAA 55 (L) 07/30/2017 1322   GFRNONAA >60 06/05/2011 1005   GFRAA >60 07/30/2017 1322   GFRAA >60 06/05/2011 1005    Lipid Panel  No results found for: CHOL, TRIG, HDL, CHOLHDL, VLDL, LDLCALC  CBC    Component Value Date/Time   WBC 9.8 08/26/2017 1356   RBC 4.26 08/26/2017 1356  HGB 14.6 08/26/2017 1356   HCT 42.3 08/26/2017 1356   PLT 318.0 08/26/2017 1356   MCV 99.2 08/26/2017 1356   MCH 33.9 (H) 07/31/2017 1550   MCHC 34.6 08/26/2017 1356   RDW 13.5 08/26/2017 1356   LYMPHSABS 3.9 08/26/2017 1356   MONOABS 0.6 08/26/2017 1356   EOSABS 0.3 08/26/2017 1356   BASOSABS 0.1 08/26/2017 1356    Hgb A1C No results found for: HGBA1C         Assessment & Plan:   RLQ Pain:  Concern for ovarian etiology Urinalysis normal No need to send urine culture Toradol 30 mg IM today- We discussed risk of recurrent upper GI Bleed, but she is now on Pantoprazole with a negative upper GI. We both agree that benefits outweigh the risk Will obtain pelvic/transvaginal ultrasound RX for Tramadol 50 mg Q8H prn provided  Will follow up after ultrasound is read, return/ER precautions disucssed Webb Silversmith, NP

## 2017-10-05 NOTE — Patient Instructions (Signed)

## 2017-10-06 ENCOUNTER — Telehealth: Payer: Self-pay

## 2017-10-06 ENCOUNTER — Ambulatory Visit
Admission: RE | Admit: 2017-10-06 | Discharge: 2017-10-06 | Disposition: A | Discharge status: Home / Self Care | Payer: BLUE CROSS/BLUE SHIELD | Source: Ambulatory Visit | Attending: Internal Medicine | Admitting: Internal Medicine

## 2017-10-06 DIAGNOSIS — N83291 Other ovarian cyst, right side: Secondary | ICD-10-CM | POA: Diagnosis not present

## 2017-10-06 DIAGNOSIS — R1031 Right lower quadrant pain: Secondary | ICD-10-CM | POA: Diagnosis not present

## 2017-10-06 DIAGNOSIS — N83201 Unspecified ovarian cyst, right side: Secondary | ICD-10-CM

## 2017-10-06 NOTE — Telephone Encounter (Signed)
Call pt;  Ultrasound shows complex right ovarian cyst. She needs to follow up with GYN. Does she have one or need referral?

## 2017-10-06 NOTE — Telephone Encounter (Addendum)
Julie [facility] [place] called report for Transvaginal US and Transabdominal US. Pt waiting. Report taken to Avie Echevaria NP and is in Epic. Rollene Fare had already seen report and tell pt she can leave; Almyra Free voiced understanding.

## 2017-10-06 NOTE — Telephone Encounter (Signed)
Spoke to pt and informed her of results and instruction. States she is needing a referral to GYN and is requesting a STAT appt if possible as she is in a lot of pain

## 2017-10-07 NOTE — Telephone Encounter (Signed)
Patient checking status of referral to GYN. Call back [phone]

## 2017-10-07 NOTE — Addendum Note (Signed)
Addended by: Jearld Fenton on: 10/07/2017 02:21 PM   Modules accepted: Orders

## 2017-10-07 NOTE — Telephone Encounter (Signed)
Referral placed.

## 2017-10-09 ENCOUNTER — Encounter: Payer: Self-pay | Admitting: Obstetrics & Gynecology

## 2017-10-09 ENCOUNTER — Other Ambulatory Visit (HOSPITAL_COMMUNITY)
Admission: RE | Admit: 2017-10-09 | Discharge: 2017-10-09 | Disposition: A | Discharge status: Home / Self Care | Payer: BLUE CROSS/BLUE SHIELD | Source: Ambulatory Visit | Attending: Obstetrics & Gynecology | Admitting: Obstetrics & Gynecology

## 2017-10-09 ENCOUNTER — Ambulatory Visit (INDEPENDENT_AMBULATORY_CARE_PROVIDER_SITE_OTHER): Payer: BLUE CROSS/BLUE SHIELD | Admitting: Obstetrics & Gynecology

## 2017-10-09 VITALS — BP 120/80 | Ht 64.0 in | Wt 137.0 lb

## 2017-10-09 DIAGNOSIS — N83291 Other ovarian cyst, right side: Secondary | ICD-10-CM

## 2017-10-09 DIAGNOSIS — Z1272 Encounter for screening for malignant neoplasm of vagina: Secondary | ICD-10-CM

## 2017-10-09 DIAGNOSIS — Z124 Encounter for screening for malignant neoplasm of cervix: Secondary | ICD-10-CM | POA: Diagnosis not present

## 2017-10-09 DIAGNOSIS — Z01419 Encounter for gynecological examination (general) (routine) without abnormal findings: Secondary | ICD-10-CM | POA: Diagnosis not present

## 2017-10-09 DIAGNOSIS — R1031 Right lower quadrant pain: Secondary | ICD-10-CM

## 2017-10-09 NOTE — Patient Instructions (Signed)
CA-125 Tumor Marker Test Why am I having this test? This test is used to check the level of cancer antigen 125 (CA-125) in your blood. The CA-125 tumor marker test can be helpful in detecting ovarian cancer. The test is only performed if you are considered at high risk for ovarian cancer. Your health care provider may recommend this test if:  You have a strong family history of ovarian cancer.  You have a breast cancer antigen (BRCA) genetic defect.  If you have already been diagnosed with ovarian cancer, your health care provider may use this test to help identify the extent of the disease and to monitor your response to treatment. What kind of sample is taken? A blood sample is required for this test. It is usually collected by inserting a needle into a vein. How do I prepare for this test? There is no preparation required for this test. What are the reference ranges? Reference ranges are considered healthy ranges established after testing a large group of healthy people. Reference ranges may vary among different people, labs, and hospitals. It is your responsibility to obtain your test results. Ask the lab or department performing the test when and how you will get your results. The reference range for this test is 0-35 units/mL or less than 35 kunits/L (SI units). What do the results mean? Increased levels of CA-125 may indicate:  Certain types of cancer, including: ? Ovarian cancer. ? Pancreatic cancer. ? Colon cancer. ? Lung cancer. ? Breast cancer. ? Lymphoma.  Noncancerous (benign) disorders, including: ? Cirrhosis. ? Pregnancy. ? Endometriosis. ? Pancreatitis. ? Pelvic inflammatory disease (PID).  Talk with your health care provider to discuss your results, treatment options, and if necessary, the need for more tests. Talk with your health care provider if you have any questions about your results. Talk with your health care provider to discuss your results, treatment  options, and if necessary, the need for more tests. Talk with your health care provider if you have any questions about your results. This information is not intended to replace advice given to you by your health care provider. Make sure you discuss any questions you have with your health care provider. Document Released: 03/25/2004 Document Revised: 11/06/2015 Document Reviewed: 07/21/2013 Elsevier Interactive Patient Education  [employer].

## 2017-10-09 NOTE — Progress Notes (Signed)
HPI: Patient is a 47 y.o. [id number] who LMP was No LMP recorded. Patient has had a hysterectomy., presents today for a problem visit.  She complains of recent findings of Right ovarion cyst by Ultrasound - Pelvic Vaginal.  Pt has had symptoms of pain, Pain has been RLQ that started 5 days ago and was intense (9/10 for 2 days and since has been 4/10; initially sharp shooting, now dull ache; no radiation; activity makes worse; Nothing makes better (has tried IBF, Tramadol Rx); assoc w n/v/d/sweating, bloating at first, not currently..  Previous evaluation: PCP, then [place]. Prior Diagnosis: Ovarian cysts in distant past, not in >20 years. Prior hysterectomy for bleeding 2007.Marland Kitchen Previous Treatment: none.  [place] 10/06/17 TRANSABDOMINAL AND TRANSVAGINAL ULTRASOUND OF PELVIS TECHNIQUE: Both transabdominal and transvaginal ultrasound examinations of the pelvis were performed. Transabdominal technique was performed for global imaging of the pelvis including uterus, ovaries, adnexal regions, and pelvic cul-de-sac. It was necessary to proceed with endovaginal exam following the transabdominal exam to visualize the uterus and ovaries. COMPARISON:  No recent prior. FINDINGS: Uterus Measurements: Prior hysterectomy. Right ovary Measurements: 3.1 x 2.2 x 2.4 cm.  2.3 cm complex cyst Left ovary Measurements: 2.5 x 1.6 x 1.4 cm. Normal appearance/no adnexal mass. Other findings No abnormal free fluid. IMPRESSION: 2.3 cm complex cyst right ovary. This could represent hemorrhagic cyst. Other etiologies complex cysts cannot be excluded. Short-interval follow up ultrasound in 6-12 weeks is recommended.  PMHx: She  has a past medical history of Allergy (2003), Anxiety, Blood clot in vein, GERD (gastroesophageal reflux disease), Observation or evaluation for suspected condition (04/28/2007), Raynaud's disease, and Skin necrosis ([place]). Also,  has a past surgical history that includes Tubal ligation (1995);  Cholecystectomy (1995); Groin dissection (05/2000); Cystectomy (1990); Vaginal delivery; MRI (07/2004); Thoracic aortogram (09/23/2006); Abdominal hysterectomy; Colonoscopy with propofol (N/A, 08/13/2017); Esophagogastroduodenoscopy (egd) with propofol (N/A, 08/13/2017); and polypectomy (N/A, 08/13/2017)., family history includes Alcohol abuse in her brother and maternal grandmother; Breast cancer in her mother; Cancer in her father, maternal grandfather, and maternal grandmother; Cancer (age of onset: 78) in her mother; Hypertension in her father; Stroke in her father and maternal grandmother.,  reports that she has been smoking cigarettes.  She has a 33.00 pack-year smoking history. She has never used smokeless tobacco. She reports that she drinks about 4.2 oz of alcohol per week. She reports that she does not use drugs.  She has a current medication list which includes the following prescription(s): multivitamin, pantoprazole, sertraline, and tramadol. Also, has No Known Allergies.  Review of Systems  Constitutional: Positive for malaise/fatigue. Negative for chills and fever.  HENT: Negative for congestion, sinus pain and sore throat.   Eyes: Negative for blurred vision and pain.  Respiratory: Negative for cough and wheezing.   Cardiovascular: Negative for chest pain and leg swelling.  Gastrointestinal: Positive for diarrhea and nausea. Negative for abdominal pain, constipation, heartburn and vomiting.  Genitourinary: Positive for dysuria. Negative for frequency, hematuria and urgency.  Musculoskeletal: Negative for back pain, joint pain, myalgias and neck pain.  Skin: Negative for itching and rash.  Neurological: Negative for dizziness, tremors and weakness.  Endo/Heme/Allergies: Does not bruise/bleed easily.  Psychiatric/Behavioral: Negative for depression. The patient is not nervous/anxious and does not have insomnia.     Objective: BP 120/80   Ht 5\' 4"  (1.626 m)   Wt 137 lb (62.1 kg)    BMI 23.52 kg/m  Physical Exam  Constitutional: She is oriented to person, place, and time. She appears well-developed  and well-nourished. No distress.  Genitourinary: Rectum normal and vagina normal. Pelvic exam was performed with patient supine. There is no rash or lesion on the right labia. There is no rash or lesion on the left labia. Vagina exhibits no lesion. No bleeding in the vagina.  Right adnexum displays tenderness and displays fullness. Right adnexum does not display mass. Left adnexum does not display mass and does not display tenderness.  Genitourinary Comments: Absent Uterus Absent cervix Vaginal cuff well healed Min cystocele  HENT:  Head: Normocephalic and atraumatic. Head is without laceration.  Right Ear: Hearing normal.  Left Ear: Hearing normal.  Nose: No epistaxis.  No foreign bodies.  Mouth/Throat: Uvula is midline, oropharynx is clear and moist and mucous membranes are normal.  Eyes: Pupils are equal, round, and reactive to light.  Neck: Normal range of motion. Neck supple. No thyromegaly present.  Cardiovascular: Normal rate and regular rhythm. Exam reveals no gallop and no friction rub.  No murmur heard. Pulmonary/Chest: Effort normal and breath sounds normal. No respiratory distress. She has no wheezes. Right breast exhibits no mass, no skin change and no tenderness. Left breast exhibits no mass, no skin change and no tenderness.  Abdominal: Soft. Bowel sounds are normal. She exhibits no distension. There is no tenderness. There is no rebound.  Musculoskeletal: Normal range of motion.  Neurological: She is alert and oriented to person, place, and time. No cranial nerve deficit.  Skin: Skin is warm and dry.  Psychiatric: She has a normal mood and affect. Judgment normal.  Vitals reviewed.  ASSESSMENT/PLAN:   Problem List Items Addressed This Visit      Genitourinary   Complex cyst of right ovary     Other   RLQ abdominal pain - Primary    CA125 Option  for monitoring pain and for cyst resolution over next 4-6 weeks, vs laparoscopy and ovarian cystectomy.  Pros and cons of each. As improving, will wait this weekend and see how she is next week. Also see results from CA125 (anticipate normal, pros and cons of this lab discussed). Risk of torsion, rupture, pain discussed.  Barnett Applebaum, MD, [facility], [facility] 10/09/2017  2:07 PM

## 2017-10-10 LAB — CA 125: CANCER ANTIGEN (CA) 125: 18.8 U/mL (ref 0.0–38.1)

## 2017-10-14 LAB — CYTOLOGY - PAP
Diagnosis: NEGATIVE
HPV: DETECTED — AB

## 2017-11-11 ENCOUNTER — Other Ambulatory Visit: Payer: BLUE CROSS/BLUE SHIELD

## 2017-11-11 ENCOUNTER — Ambulatory Visit: Payer: BLUE CROSS/BLUE SHIELD | Admitting: Obstetrics & Gynecology

## 2017-12-20 ENCOUNTER — Other Ambulatory Visit: Payer: Self-pay | Admitting: Family Medicine

## 2017-12-21 NOTE — Telephone Encounter (Signed)
Electronic refill request. Sertraline Last office visit:   10/05/17 Baity acute Last Filled:    30 tablet 5 06/10/2017  Does she need an appointment?  Please advise.

## 2017-12-22 ENCOUNTER — Encounter: Payer: Self-pay | Admitting: *Deleted

## 2017-12-22 NOTE — Telephone Encounter (Signed)
Letter mailed

## 2017-12-22 NOTE — Telephone Encounter (Signed)
Sent.  OV when possible. Thanks.

## 2018-04-03 ENCOUNTER — Other Ambulatory Visit: Payer: Self-pay | Admitting: Family Medicine

## 2018-04-05 ENCOUNTER — Encounter: Payer: Self-pay | Admitting: *Deleted

## 2018-04-17 DIAGNOSIS — K210 Gastro-esophageal reflux disease with esophagitis: Secondary | ICD-10-CM | POA: Diagnosis not present

## 2018-05-04 ENCOUNTER — Other Ambulatory Visit: Payer: Self-pay | Admitting: Family Medicine

## 2018-05-04 NOTE — Telephone Encounter (Signed)
Electronic refill request. Sertraline Last office visit:   10/05/17 Acute with Baity Last Filled:    30 tablet 0 04/05/2018  Patient was warned at last refill to schedule appointment. Please advise.

## 2018-05-05 NOTE — Telephone Encounter (Signed)
Sent.  Note is on rx for her to get f/u scheduled.  Thanks.

## 2018-06-07 ENCOUNTER — Other Ambulatory Visit: Payer: Self-pay | Admitting: Family Medicine

## 2018-06-07 NOTE — Telephone Encounter (Signed)
Electronic refill request. Sertraline Last office visit:   10/05/17 Acute Baity Last Filled:    30 tablet 0 05/05/2018  Patient was advised at last fill that OV is required. Please advise.

## 2018-06-08 NOTE — Telephone Encounter (Signed)
Sent but schedule phone f/u re: mood, either by phone or webex visit (assuming that is up and running) in the next month.  Thanks.

## 2018-06-08 NOTE — Telephone Encounter (Signed)
Left detailed message on voicemail to return call for FU visit (telephone,or Webex)

## 2018-07-11 ENCOUNTER — Other Ambulatory Visit: Payer: Self-pay | Admitting: Family Medicine

## 2018-07-12 NOTE — Telephone Encounter (Signed)
Pharmacy requests refill on: Sertraline 100mg   LAST REFILL: #30, 0 refills on 06/08/18 LAST OV: has not been seen since 05/28/17 for this issue, past due for follow up.  Needs to make virtual visit or phone visit. NEXT OV: Not on schedule [facility]ARMAC[facility]: [facility] [address]   Will give 1 refill only and forward to [place] to get patient in for phone or Virtual encounter based on patients capabilities within the next month.

## 2018-07-12 NOTE — Telephone Encounter (Signed)
Pt is scheduled for 07/13/18 @ 11:30am.

## 2018-07-13 ENCOUNTER — Encounter: Payer: Self-pay | Admitting: Family Medicine

## 2018-07-13 ENCOUNTER — Ambulatory Visit (INDEPENDENT_AMBULATORY_CARE_PROVIDER_SITE_OTHER): Payer: BLUE CROSS/BLUE SHIELD | Admitting: Family Medicine

## 2018-07-13 DIAGNOSIS — F411 Generalized anxiety disorder: Secondary | ICD-10-CM

## 2018-07-13 DIAGNOSIS — K2270 Barrett's esophagus without dysplasia: Secondary | ICD-10-CM | POA: Diagnosis not present

## 2018-07-13 MED ORDER — SERTRALINE HCL 100 MG PO TABS: 100.0000 mg | ORAL_TABLET | Freq: Every day | ORAL | 3 refills | Status: DC

## 2018-07-13 NOTE — Progress Notes (Signed)
Virtual visit completed through WebEx or similar program Patient location: home  Provider location: [place] at [facility], office   Limitations and rationale for visit method d/w patient.  Patient agreed to proceed.   CC: follow up.   HPI:  She is caring for her parents. She is living with them.  She is also going back at work.  Pandemic considerations d/w pt.  She doesn't have sx of covid.  She has h/o Barrett's and is on PPI.    She isn't due for EGD at this point.  No dysphagia.  Compliant with med.  Path/phys d/w pt.    Mood is good.  Med clearly helped per patient report.  No SI/HI.  No ADE on med.  Compliant.  She wanted to continue as is.  She failed taper off med prev.    Meds and allergies reviewed.   Past medical history reviewed.  ROS: Per HPI unless specifically indicated in ROS section   NAD Speech wnl  A/P:  Mood is good.  Med clearly helped per patient report.  No SI/HI.  No ADE on med.  Compliant.  She wanted to continue as is.  She failed taper off med prev.  Continue as is.   She has h/o Barrett's and is on PPI.    She isn't due for EGD at this point.  No dysphagia.  Compliant with med.  Path/phys d/w pt.   She can check with GI later this year re: refills.  Also I can fill PPI if needed.

## 2018-07-14 ENCOUNTER — Encounter: Payer: Self-pay | Admitting: Family Medicine

## 2018-07-14 NOTE — Assessment & Plan Note (Signed)
She has h/o Barrett's and is on PPI.    She isn't due for EGD at this point.  No dysphagia.  Compliant with med.  Path/phys d/w pt.   She can check with GI later this year re: refills.  Also I can fill PPI if needed.

## 2018-07-14 NOTE — Assessment & Plan Note (Signed)
Mood is good.  Med clearly helped per patient report.  No SI/HI.  No ADE on med.  Compliant.  She wanted to continue as is.  She failed taper off med prev.  Continue as is.

## 2018-09-06 ENCOUNTER — Other Ambulatory Visit: Payer: Self-pay | Admitting: Gastroenterology

## 2018-12-17 ENCOUNTER — Telehealth: Payer: Self-pay

## 2018-12-17 MED ORDER — CHANTIX STARTING MONTH PAK 0.5 MG X 11 & 1 MG X 42 PO TABS: ORAL_TABLET | ORAL | 0 refills | Status: DC

## 2018-12-17 MED ORDER — VARENICLINE TARTRATE 1 MG PO TABS: 1.0000 mg | ORAL_TABLET | Freq: Two times a day (BID) | ORAL | 1 refills | Status: DC

## 2018-12-17 NOTE — Telephone Encounter (Signed)
Sent but have patient update [place] if any mood changes on med.  I appreciate her effort to stop smoking.

## 2018-12-17 NOTE — Telephone Encounter (Signed)
Patient called asking Dr Damita Dunnings to prescribe Chantix for her. She has tried this before about 7 to 8 years ago and it helped. [id number] [place], [place] - [address]. CB: [phone]

## 2018-12-17 NOTE — Telephone Encounter (Signed)
Left detailed message on voicemail.  

## 2018-12-17 NOTE — Addendum Note (Signed)
Addended by: Tonia Ghent on: 12/17/2018 04:48 PM   Modules accepted: Orders

## 2018-12-27 ENCOUNTER — Telehealth: Payer: Self-pay | Admitting: Family Medicine

## 2018-12-27 ENCOUNTER — Other Ambulatory Visit: Payer: Self-pay

## 2018-12-27 DIAGNOSIS — Z20822 Contact with and (suspected) exposure to covid-19: Secondary | ICD-10-CM

## 2018-12-27 DIAGNOSIS — Z20828 Contact with and (suspected) exposure to other viral communicable diseases: Secondary | ICD-10-CM | POA: Diagnosis not present

## 2018-12-27 NOTE — Telephone Encounter (Signed)
Noted. Thanks. Please add her to the follow up covid list for a follow up phone call in 1-2 days.

## 2018-12-27 NOTE — Telephone Encounter (Signed)
Patient stated she was exposed to a positive covid case. She was given testing locations and times. Also advised to self quarantine  Until her results have returned and someone contacts her with results and what she should do next .Marland Kitchen She will be going to the [facility] location

## 2018-12-28 LAB — NOVEL CORONAVIRUS, NAA: SARS-CoV-2, NAA: NOT DETECTED

## 2018-12-28 NOTE — Telephone Encounter (Signed)
Krystal Mercado, CMA  You 16 hours ago (2:54 PM)   I spoke with patient & she was exposed to two positive cases [place]. Patient did not get tested until today. She feels good today & has had no symptoms. Patient only felt bad Saturday & that's when she had taken two doses of the chantix. She felt nauseated, swimmy headed, HA & watery eyes. She stopped taking the chantix & has felt fine since.    Routing comment    ====================== Noted.  I will await the COVID test.  If she thought her symptoms were related to Chantix, then I would hold that in the meantime.  Thanks.

## 2018-12-29 NOTE — Telephone Encounter (Signed)
Her test is negative which is good news.  Per CDC guidelines, even with a negative test it still makes sense to quarantine for a total of 14 days from the last date of known exposure since symptoms can appear up to 14 days later and it is possible to have a false negative on the test.  Thanks.

## 2018-12-29 NOTE — Telephone Encounter (Signed)
Result is in, please review and then I will call patient and check on how she is doing

## 2018-12-29 NOTE — Telephone Encounter (Signed)
I contacted patient and let her know of these results and Dr. Josefine Class recommendations. Patient verbalized understanding.

## 2019-01-11 ENCOUNTER — Other Ambulatory Visit: Payer: Self-pay | Admitting: *Deleted

## 2019-01-11 ENCOUNTER — Telehealth: Payer: Self-pay | Admitting: *Deleted

## 2019-01-11 DIAGNOSIS — Z20822 Contact with and (suspected) exposure to covid-19: Secondary | ICD-10-CM

## 2019-01-11 NOTE — Telephone Encounter (Signed)
Pt just left from our Covid-19 testing site and they testers there advise pt to call her PCP so we are aware of her sxs, pt said she has fever of 101, HA, chills, cough, loss of taste and smell, abd pain/cramping, and body aches. No trouble breathing and no SOB, pt's sxs started yesterday, and loss of taste and smell started this morning.   [facility] [address]   CB # [phone]

## 2019-01-11 NOTE — Telephone Encounter (Signed)
Left detailed message on voicemail. DPR 

## 2019-01-11 NOTE — Telephone Encounter (Signed)
Noted. Will await the test result.  If worse in the meantime, have her update [place].  Please add her to the follow up list.   Rec quarantine and supportive tx in the meantime, tylenol for fevers, drink plenty of fluids, cough drops as needed.   I hope she feels better soon.   Thanks.

## 2019-01-13 ENCOUNTER — Other Ambulatory Visit: Payer: Self-pay | Admitting: Family Medicine

## 2019-01-13 LAB — NOVEL CORONAVIRUS, NAA: SARS-CoV-2, NAA: DETECTED — AB

## 2019-01-13 NOTE — Telephone Encounter (Signed)
Electronic refill request. Chantix Last office visit:   07/13/2018 Last Filled:    53 tablet 0 12/17/2018  Please advise.

## 2019-01-14 NOTE — Telephone Encounter (Signed)
Please check with patient/pharmacy.  If she has used the starter pack then she needs to use the other prescription for the continuation pack.  Thanks.

## 2019-01-17 NOTE — Telephone Encounter (Signed)
Patient says she can't use Chantix because it made her very sick.  Please refuse all refills at this point.  Also asked for an update on the patient for + Covid and patient states she is feeling better the last few days, only a very low grade fever.

## 2019-01-17 NOTE — Telephone Encounter (Signed)
Noted. Thanks.  Med/allergy list updated.  

## 2019-07-11 ENCOUNTER — Other Ambulatory Visit: Payer: Self-pay | Admitting: Gastroenterology

## 2019-07-25 ENCOUNTER — Other Ambulatory Visit: Payer: Self-pay | Admitting: Family Medicine

## 2019-08-22 ENCOUNTER — Other Ambulatory Visit: Payer: Self-pay | Admitting: Family Medicine

## 2019-09-19 ENCOUNTER — Other Ambulatory Visit: Payer: Self-pay | Admitting: Family Medicine

## 2019-09-20 ENCOUNTER — Encounter: Payer: Self-pay | Admitting: Family Medicine

## 2019-09-20 NOTE — Telephone Encounter (Signed)
Electronic refill request. Sertraline Last office visit:   07/13/2018 Last Filled:    30 tablet 0 08/22/2019  Notation was made on the last refill to schedule CPE, no exam scheduled.

## 2019-09-29 DIAGNOSIS — M7632 Iliotibial band syndrome, left leg: Secondary | ICD-10-CM | POA: Diagnosis not present

## 2019-09-29 DIAGNOSIS — M5382 Other specified dorsopathies, cervical region: Secondary | ICD-10-CM | POA: Diagnosis not present

## 2019-09-29 DIAGNOSIS — M9901 Segmental and somatic dysfunction of cervical region: Secondary | ICD-10-CM | POA: Diagnosis not present

## 2019-09-29 DIAGNOSIS — M9902 Segmental and somatic dysfunction of thoracic region: Secondary | ICD-10-CM | POA: Diagnosis not present

## 2019-10-23 ENCOUNTER — Other Ambulatory Visit: Payer: Self-pay | Admitting: Family Medicine

## 2019-10-23 DIAGNOSIS — K2270 Barrett's esophagus without dysplasia: Secondary | ICD-10-CM

## 2019-10-23 DIAGNOSIS — Z13220 Encounter for screening for lipoid disorders: Secondary | ICD-10-CM

## 2019-10-27 ENCOUNTER — Other Ambulatory Visit: Payer: Self-pay | Admitting: Gastroenterology

## 2019-10-27 ENCOUNTER — Other Ambulatory Visit: Payer: Self-pay | Admitting: Family Medicine

## 2019-11-02 ENCOUNTER — Other Ambulatory Visit: Payer: Self-pay

## 2019-11-02 ENCOUNTER — Other Ambulatory Visit (INDEPENDENT_AMBULATORY_CARE_PROVIDER_SITE_OTHER): Payer: BC Managed Care – PPO

## 2019-11-02 DIAGNOSIS — Z13220 Encounter for screening for lipoid disorders: Secondary | ICD-10-CM

## 2019-11-02 DIAGNOSIS — K2270 Barrett's esophagus without dysplasia: Secondary | ICD-10-CM

## 2019-11-02 LAB — BASIC METABOLIC PANEL
BUN: 10 mg/dL (ref 6–23)
CO2: 27 mEq/L (ref 19–32)
Calcium: 9.4 mg/dL (ref 8.4–10.5)
Chloride: 99 mEq/L (ref 96–112)
Creatinine, Ser: 0.95 mg/dL (ref 0.40–1.20)
GFR: 62.29 mL/min (ref >60.00)
Glucose, Bld: 94 mg/dL (ref 70–99)
Potassium: 4.1 mEq/L (ref 3.5–5.1)
Sodium: 136 mEq/L (ref 135–145)

## 2019-11-02 LAB — LIPID PANEL
Cholesterol: 267 mg/dL — ABNORMAL HIGH (ref 0–200)
HDL: 41.4 mg/dL (ref >39.00)
Total CHOL/HDL Ratio: 6
Triglycerides: 586 mg/dL — ABNORMAL HIGH (ref 0.0–149.0)

## 2019-11-02 LAB — LDL CHOLESTEROL, DIRECT: Direct LDL: 150 mg/dL

## 2019-11-08 ENCOUNTER — Other Ambulatory Visit: Payer: Self-pay

## 2019-11-08 ENCOUNTER — Encounter: Payer: Self-pay | Admitting: Family Medicine

## 2019-11-08 ENCOUNTER — Ambulatory Visit (INDEPENDENT_AMBULATORY_CARE_PROVIDER_SITE_OTHER): Payer: BC Managed Care – PPO | Admitting: Family Medicine

## 2019-11-08 VITALS — BP 142/78 | HR 88 | Temp 97.3°F | Ht 63.5 in | Wt 130.1 lb

## 2019-11-08 DIAGNOSIS — Z0000 Encounter for general adult medical examination without abnormal findings: Secondary | ICD-10-CM

## 2019-11-08 DIAGNOSIS — E785 Hyperlipidemia, unspecified: Secondary | ICD-10-CM

## 2019-11-08 DIAGNOSIS — Z7189 Other specified counseling: Secondary | ICD-10-CM

## 2019-11-08 DIAGNOSIS — F411 Generalized anxiety disorder: Secondary | ICD-10-CM

## 2019-11-08 DIAGNOSIS — K2270 Barrett's esophagus without dysplasia: Secondary | ICD-10-CM

## 2019-11-08 MED ORDER — PANTOPRAZOLE SODIUM 40 MG PO TBEC
40.0000 mg | DELAYED_RELEASE_TABLET | Freq: Every day | ORAL | 3 refills | Status: DC
Start: 2019-11-08 — End: 2020-11-21

## 2019-11-08 MED ORDER — SERTRALINE HCL 100 MG PO TABS
100.0000 mg | ORAL_TABLET | Freq: Every day | ORAL | 3 refills | Status: DC
Start: 2019-11-08 — End: 2020-06-13

## 2019-11-08 NOTE — Patient Instructions (Addendum)
You can call for a mammogram at [facility].  [place] working on tapering smoking and work on [employer].  Recheck fasting labs in about 2-3 months.  We'll go from there.  Take care.  Glad to see you.

## 2019-11-08 NOTE — Progress Notes (Signed)
This visit occurred during the SARS-CoV-2 public health emergency.  Safety protocols were in place, including screening questions prior to the visit, additional usage of staff PPE, and extensive cleaning of exam room while observing appropriate contact time as indicated for disinfecting solutions.  CPE- See plan.  Routine anticipatory guidance given to patient.  See health maintenance.  The possibility exists that previously documented standard health maintenance information may have been brought forward from a previous encounter into this note.  If needed, that same information has been updated to reflect the current situation based on today's encounter.    Tetanus 2012 Flu encouraged.   Shingles not due PNA not due covid vaccine 2021 Smoking d/w pt.  She didn't tolerate chantix.  She is considering options, tapering down slowly. Mammogram d/w pt.  She can call for appointment, see avs.   DXA not due, d/w pt.   Colonoscopy 2019 Living will d/w pt.  Daughter designated if patient were incapacitated.    Smoking cessation discussed.  TG elevation d/w pt.  She was fasting.    Mood d/w pt.  Still on zoloft, with relief.  She wanted to continue and failed taper prev.  No SI/HI.    PPI use d/w pt.  H/o Barrett's noted.  No no dysphagia.  EGD 2019  PMH and SH reviewed  Meds, vitals, and allergies reviewed.   ROS: Per HPI.  Unless specifically indicated otherwise in HPI, the patient denies:  General: fever. Eyes: acute vision changes ENT: sore throat Cardiovascular: chest pain Respiratory: SOB GI: vomiting GU: dysuria Musculoskeletal: acute back pain Derm: acute rash Neuro: acute motor dysfunction Psych: worsening mood Endocrine: polydipsia Heme: bleeding Allergy: hayfever  GEN: nad, alert and oriented HEENT: ncat NECK: supple w/o LA CV: rrr. PULM: ctab, no inc wob ABD: soft, +bs EXT: no edema SKIN: no acute rash

## 2019-11-13 DIAGNOSIS — E785 Hyperlipidemia, unspecified: Secondary | ICD-10-CM | POA: Insufficient documentation

## 2019-11-13 DIAGNOSIS — Z0000 Encounter for general adult medical examination without abnormal findings: Secondary | ICD-10-CM | POA: Insufficient documentation

## 2019-11-13 DIAGNOSIS — Z7189 Other specified counseling: Secondary | ICD-10-CM | POA: Insufficient documentation

## 2019-11-13 NOTE — Assessment & Plan Note (Signed)
I think it makes sense to recheck her labs in a few months with her working on diet and exercise in the meantime before we do anything else.  She agrees.

## 2019-11-13 NOTE — Assessment & Plan Note (Signed)
Continue PPI.  Rationale discussed with patient.  Not yet due for follow-up EGD per GI.

## 2019-11-13 NOTE — Assessment & Plan Note (Signed)
Living will d/w pt.  Daughter designated if patient were incapacitated.

## 2019-11-13 NOTE — Assessment & Plan Note (Signed)
Tetanus 2012 Flu encouraged.   Shingles not due PNA not due covid vaccine 2021 Smoking d/w pt.  She didn't tolerate chantix.  She is considering options, tapering down slowly. Mammogram d/w pt.  She can call for appointment, see avs.   DXA not due, d/w pt.   Colonoscopy 2019 Living will d/w pt.  Daughter designated if patient were incapacitated.

## 2019-11-13 NOTE — Assessment & Plan Note (Signed)
Continue Zoloft.  She failed taper previously.  No adverse effect on medication.  It helps.  She agrees with plan.

## 2020-01-13 ENCOUNTER — Telehealth: Payer: Self-pay

## 2020-01-13 NOTE — Telephone Encounter (Signed)
Agree with eval today.  Will await UC notes.

## 2020-01-13 NOTE — Telephone Encounter (Signed)
Pt said dentist gave pt abx,augmentin, pt started med 10 days ago; hives and whelps started today after finishing the abx 01/12/20. No swelling in mouth,lips,tongue, or throat; does not have true difficulty breathing but has SOB on and off when itching.Pt also has soreness on lt side of eye, nose and face; vision has not been affected. Pt said the Benadryl knocks her out. Pt is going to [facility] on [address] now. No available appts at [facility] today. FYI to Dr Damita Dunnings.

## 2020-04-09 DIAGNOSIS — M5382 Other specified dorsopathies, cervical region: Secondary | ICD-10-CM | POA: Diagnosis not present

## 2020-04-09 DIAGNOSIS — M9902 Segmental and somatic dysfunction of thoracic region: Secondary | ICD-10-CM | POA: Diagnosis not present

## 2020-04-09 DIAGNOSIS — S134XXA Sprain of ligaments of cervical spine, initial encounter: Secondary | ICD-10-CM | POA: Diagnosis not present

## 2020-04-09 DIAGNOSIS — M9901 Segmental and somatic dysfunction of cervical region: Secondary | ICD-10-CM | POA: Diagnosis not present

## 2020-04-09 DIAGNOSIS — M9907 Segmental and somatic dysfunction of upper extremity: Secondary | ICD-10-CM | POA: Diagnosis not present

## 2020-04-12 DIAGNOSIS — M9901 Segmental and somatic dysfunction of cervical region: Secondary | ICD-10-CM | POA: Diagnosis not present

## 2020-04-12 DIAGNOSIS — M5382 Other specified dorsopathies, cervical region: Secondary | ICD-10-CM | POA: Diagnosis not present

## 2020-04-12 DIAGNOSIS — M9902 Segmental and somatic dysfunction of thoracic region: Secondary | ICD-10-CM | POA: Diagnosis not present

## 2020-04-12 DIAGNOSIS — M9907 Segmental and somatic dysfunction of upper extremity: Secondary | ICD-10-CM | POA: Diagnosis not present

## 2020-04-12 DIAGNOSIS — S134XXA Sprain of ligaments of cervical spine, initial encounter: Secondary | ICD-10-CM | POA: Diagnosis not present

## 2020-06-13 ENCOUNTER — Telehealth: Payer: Self-pay

## 2020-06-13 MED ORDER — HYDROXYZINE HCL 10 MG PO TABS: 5.0000 mg | ORAL_TABLET | Freq: Three times a day (TID) | ORAL | 1 refills | Status: DC | PRN

## 2020-06-13 MED ORDER — SERTRALINE HCL 100 MG PO TABS
150.0000 mg | ORAL_TABLET | Freq: Every day | ORAL | Status: DC
Start: 2020-06-13 — End: 2020-11-22

## 2020-06-13 NOTE — Telephone Encounter (Signed)
Pt left v/m that she is taking sertraline 100 mg one daily and with the extra life stresses (pt is taking care of her mother on hospice) pt need something extra; pt wants to know if can increase sertraline mg or add another med. Pt last seen 11/08/19. I spoke with pt. Pt said for the last  3 wks she is under more stress; pt's entire body shakes in the mornings; pt takes sertraline 100 mg at hs. No SI/HI. Pt feels like she is having a nervous break down.pt said she cannot leave her mom because no one else is taking care of her mom; does not leave her mom and is staying with her 24/7. Pt needs med change so does not feel so anxious and overwhelmed.pt cannot schedule appt or go to UC or ED. UC & ED precautions given and pt voiced understanding. Pt said trying to find someone that will help with her mom but so far no luck. Sending note to DR Damita Dunnings who is out of office and Dr Darnell Level who is in office. [facility].

## 2020-06-13 NOTE — Telephone Encounter (Signed)
Please give her my condolences.  I would increase sertraline to 150 mg a day.  She can take 1.5 of the 100 mg tablets.  She can take the entire dose at the same time.  She does not have to split it into morning and nighttime dosing.  Let me know if she needs a refill.  That should eventually help but in the short-term she can also take hydroxyzine 5-10 mg as needed for anxiety.  That should not cause sedation at such a low dose.  It may help with anxiety.  I sent the prescription for hydroxyzine.  Please update me in a few days, sooner if needed.  Thanks.

## 2020-06-13 NOTE — Telephone Encounter (Signed)
LMTCB

## 2020-06-13 NOTE — Telephone Encounter (Signed)
Spoke with patient about medication changes. Patient verbalized understanding and will update in a few days or sooner if needed. Also advised patient to call back if or when she needs a refill on the sertraline.

## 2020-11-12 ENCOUNTER — Ambulatory Visit
Admission: EM | Admit: 2020-11-12 | Discharge: 2020-11-12 | Disposition: A | Discharge status: Home / Self Care | Payer: BC Managed Care – PPO | Attending: Emergency Medicine | Admitting: Emergency Medicine

## 2020-11-12 ENCOUNTER — Encounter: Payer: Self-pay | Admitting: Emergency Medicine

## 2020-11-12 ENCOUNTER — Other Ambulatory Visit: Payer: Self-pay

## 2020-11-12 DIAGNOSIS — W57XXXA Bitten or stung by nonvenomous insect and other nonvenomous arthropods, initial encounter: Secondary | ICD-10-CM

## 2020-11-12 DIAGNOSIS — L089 Local infection of the skin and subcutaneous tissue, unspecified: Secondary | ICD-10-CM

## 2020-11-12 DIAGNOSIS — S70361A Insect bite (nonvenomous), right thigh, initial encounter: Secondary | ICD-10-CM | POA: Diagnosis not present

## 2020-11-12 MED ORDER — DOXYCYCLINE HYCLATE 100 MG PO CAPS: 100.0000 mg | ORAL_CAPSULE | Freq: Two times a day (BID) | ORAL | 0 refills | Status: AC

## 2020-11-12 NOTE — ED Provider Notes (Signed)
Chief Complaint   Chief Complaint  Patient presents with   Insect Bite     Subjective, HPI  Krystal Mercado is a very pleasant 50 y.o. female who presents with insect bite to right inner thigh for which she is concerned for a bull's-eye rash.  Patient reports that she noticed this area about 2 to 3 days ago.  Patient does not report removing a tick from the area.  She does report some slight joint aches.  No fever, chills or vomiting reported.  No topical medications used.  History obtained from patient.   Patient's problem list, past medical and social history, medications, and allergies were reviewed by me and updated in Epic.    ROS  See HPI.  Objective   Vitals:   11/12/20 0955  BP: 135/82  Pulse: 74  Resp: 20  Temp: 98.9 F (37.2 C)  SpO2: 98%    Vital signs and nursing note reviewed.  General: Appears well-developed and well-nourished. No acute distress.  HEENT: Normocephalic, atraumatic, hearing grossly intact. EOMI, no drainage. No rhinorrhea. Moist mucous membranes.  Neck: Normal range of motion, neck is supple.  Cardiovascular: Normal rate.  Pulm/Chest: No respiratory distress.   Musculoskeletal: No joint deformity, normal range of motion.  Skin: Erythematous circular area of induration to the right inner thigh with central insect bite.  Area measures approximately 1.5 cm x 1.5 cm and is tender on palpation.  No drainage.  Data  No results found for any visits on 11/12/20.   Assessment & Plan  1. Nonvenomous insect bite of right thigh with infection, initial encounter - doxycycline (VIBRAMYCIN) 100 MG capsule; Take 1 capsule (100 mg total) by mouth 2 (two) times daily for 7 days.  Dispense: 14 capsule; Refill: 0  50 y.o. female presents with insect bite to right inner thigh for which she is concerned for a bull's-eye rash.  Patient reports that she noticed this area about 2 to 3 days ago.  Patient does not report removing a tick from the area.  She does  report some slight joint aches.  No fever, chills or vomiting reported.  No topical medications used.  Chart review completed.  Given symptoms along with assessment findings, concern for infection secondary to insect bite.  Rx doxycycline to the patient's preferred pharmacy and advised about home treatment and care to include eating a full meal with each dose of her antibiotics along with precautions to observe while she is taking this medication as such, do not mix with calcium as this can decrease the effectiveness of this drug and to cover up and use sunscreen while outside as this medication can make her a bit sun sensitive.  Advised use of Benadryl versus Zyrtec along with famotidine.  Advised to follow-up with PCP if area does not improve in 5 to 6 days or sooner if the area worsens or she develops a fever.  Patient verbalized understanding and agreed with plan.  Patient stable upon discharge.  Return as needed.  Plan:   Discharge Instructions      Doxycycline may make you more sun sensitive.  Cover up and use sunscreen while taking it. Sit upright for 30 minutes after taking this medication. If you take a calcium supplement, take it between doses of the antibiotic. Eat a full meal with each dose of this medication to help prevent stomach upset.  Take/use all medications as prescribed. Increase fluid intake. You may take Benadryl 25-'50mg'$  every 4-6 hours as needed OR Zyrtec  $'10mg'b$  daily for itching/inflammation. You may also take over the counter Famotidine '20mg'$  once daily to help with itching/inflammation. You may apply ice wrapped in a towel to affected area 3-5 times daily for 10-15 minute intervals. See your PCP if area does not improve in 5-6 days. See your PCP or return to clinic sooner if area worsens or you develop fever.          [name], [place] 11/12/20 1009

## 2020-11-12 NOTE — Discharge Instructions (Addendum)
Doxycycline may make you more sun sensitive.  Cover up and use sunscreen while taking it. Sit upright for 30 minutes after taking this medication. If you take a calcium supplement, take it between doses of the antibiotic. Eat a full meal with each dose of this medication to help prevent stomach upset.  Take/use all medications as prescribed. Increase fluid intake. You may take Benadryl 25-'50mg'$  every 4-6 hours as needed OR Zyrtec '10mg'$  daily for itching/inflammation. You may also take over the counter Famotidine '20mg'$  once daily to help with itching/inflammation. You may apply ice wrapped in a towel to affected area 3-5 times daily for 10-15 minute intervals. See your PCP if area does not improve in 5-6 days. See your PCP or return to clinic sooner if area worsens or you develop fever.

## 2020-11-12 NOTE — ED Triage Notes (Signed)
Pt here with tick bite rash that resembles bullseye on upper right thigh x 3 days. Slight joint aches.

## 2020-11-18 ENCOUNTER — Other Ambulatory Visit: Payer: Self-pay | Admitting: Family Medicine

## 2020-11-19 ENCOUNTER — Other Ambulatory Visit: Payer: Self-pay | Admitting: Family Medicine

## 2020-11-20 NOTE — Telephone Encounter (Signed)
Refill request for Protonix 40 mg tablets  LOV - 11/08/19 Next OV - not scheduled Last refill - 11/08/19 #90/3

## 2020-11-21 NOTE — Telephone Encounter (Signed)
Sent. Please schedule yearly visit when possible.  Thanks.

## 2020-11-22 ENCOUNTER — Other Ambulatory Visit: Payer: Self-pay | Admitting: Family Medicine

## 2020-11-22 NOTE — Telephone Encounter (Signed)
Sertraline Last rx:  06/13/20 Last OV:  11/08/19, CPE Next OV:  12/24/20, CPE

## 2020-11-23 MED ORDER — SERTRALINE HCL 100 MG PO TABS
150.0000 mg | ORAL_TABLET | Freq: Every day | ORAL | 1 refills | Status: DC
Start: 2020-11-23 — End: 2021-06-04

## 2020-11-23 NOTE — Telephone Encounter (Signed)
Sent. Thanks.   

## 2020-12-17 ENCOUNTER — Other Ambulatory Visit: Payer: BC Managed Care – PPO

## 2020-12-24 ENCOUNTER — Other Ambulatory Visit: Payer: Self-pay

## 2020-12-24 ENCOUNTER — Encounter: Payer: Self-pay | Admitting: Family Medicine

## 2020-12-24 ENCOUNTER — Ambulatory Visit (INDEPENDENT_AMBULATORY_CARE_PROVIDER_SITE_OTHER): Payer: BC Managed Care – PPO | Admitting: Family Medicine

## 2020-12-24 VITALS — BP 126/74 | HR 93 | Temp 98.4°F | Ht 64.0 in | Wt 130.0 lb

## 2020-12-24 DIAGNOSIS — K2270 Barrett's esophagus without dysplasia: Secondary | ICD-10-CM

## 2020-12-24 DIAGNOSIS — E785 Hyperlipidemia, unspecified: Secondary | ICD-10-CM

## 2020-12-24 DIAGNOSIS — Z0000 Encounter for general adult medical examination without abnormal findings: Secondary | ICD-10-CM | POA: Diagnosis not present

## 2020-12-24 DIAGNOSIS — Z23 Encounter for immunization: Secondary | ICD-10-CM | POA: Diagnosis not present

## 2020-12-24 DIAGNOSIS — Z1159 Encounter for screening for other viral diseases: Secondary | ICD-10-CM

## 2020-12-24 DIAGNOSIS — M719 Bursopathy, unspecified: Secondary | ICD-10-CM

## 2020-12-24 DIAGNOSIS — F411 Generalized anxiety disorder: Secondary | ICD-10-CM

## 2020-12-24 DIAGNOSIS — Z114 Encounter for screening for human immunodeficiency virus [HIV]: Secondary | ICD-10-CM

## 2020-12-24 DIAGNOSIS — Z7189 Other specified counseling: Secondary | ICD-10-CM

## 2020-12-24 LAB — LIPID PANEL
Cholesterol: 253 mg/dL — ABNORMAL HIGH (ref 0–200)
HDL: 53 mg/dL (ref >39.00)
NonHDL: 199.93
Total CHOL/HDL Ratio: 5
Triglycerides: 300 mg/dL — ABNORMAL HIGH (ref 0.0–149.0)
VLDL: 60 mg/dL — ABNORMAL HIGH (ref 0.0–40.0)

## 2020-12-24 LAB — COMPREHENSIVE METABOLIC PANEL
ALT: 11 U/L (ref 0–35)
AST: 17 U/L (ref 0–37)
Albumin: 4.4 g/dL (ref 3.5–5.2)
Alkaline Phosphatase: 72 U/L (ref 39–117)
BUN: 10 mg/dL (ref 6–23)
CO2: 27 mEq/L (ref 19–32)
Calcium: 9.8 mg/dL (ref 8.4–10.5)
Chloride: 101 mEq/L (ref 96–112)
Creatinine, Ser: 0.89 mg/dL (ref 0.40–1.20)
GFR: 75.26 mL/min (ref >60.00)
Glucose, Bld: 94 mg/dL (ref 70–99)
Potassium: 4.5 mEq/L (ref 3.5–5.1)
Sodium: 138 mEq/L (ref 135–145)
Total Bilirubin: 0.5 mg/dL (ref 0.2–1.2)
Total Protein: 7.5 g/dL (ref 6.0–8.3)

## 2020-12-24 LAB — LDL CHOLESTEROL, DIRECT: Direct LDL: 168 mg/dL

## 2020-12-24 NOTE — Progress Notes (Signed)
This visit occurred during the SARS-CoV-2 public health emergency.  Safety protocols were in place, including screening questions prior to the visit, additional usage of staff PPE, and extensive cleaning of exam room while observing appropriate contact time as indicated for disinfecting solutions.  CPE- See plan.  Routine anticipatory guidance given to patient.  See health maintenance.  The possibility exists that previously documented standard health maintenance information may have been brought forward from a previous encounter into this note.  If needed, that same information has been updated to reflect the current situation based on today's encounter.    Tetanus 2012, d/w pt. Flu 2022 Shingles d/w pt.   PNA not due covid vaccine 2021 Smoking d/w pt.  she is now vaping, d/w pt.   Mammogram d/w pt.  She can call for appointment, see avs.   DXA not due, d/w pt.   Pap not due.  S/p hysterectomy.   Colonoscopy 2019 Living will d/w pt.  Daughter designated if patient were incapacitated.   Pt opts in for HIV screening.  D/w pt re: routine screening.   Pt opts in for HCV screening.  D/w pt re: routine screening.    She change from smoking to vaping with the plan to taper and then stop.    Mood d/w pt.  Still on sertraline.  It helps.  She wanted to continue with med.  Her mother died and condolences offered.  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  No SI/HI.    She has joint aches noted.  Pain near L greater trochanter.  B 1st MTP pain (she is on her feet a lot) and some hand pain.  Taking tylenol prn.    H/o HLD.  Labs pending.  See notes on labs.  H/o barrett's esophagus.  On PPI.  She had prev GI f/u.  D/w pt.  No blood in stool.  No vomiting.  See avs.  Discussed with patient about setting up routine GI follow-up.  PMH and SH reviewed  Meds, vitals, and allergies reviewed.   ROS: Per HPI.  Unless specifically indicated otherwise in HPI, the patient denies:  General:  fever. Eyes: acute vision changes ENT: sore throat Cardiovascular: chest pain Respiratory: SOB GI: vomiting GU: dysuria Musculoskeletal: acute back pain Derm: acute rash Neuro: acute motor dysfunction Psych: worsening mood Endocrine: polydipsia Heme: bleeding Allergy: hayfever  GEN: nad, alert and oriented HEENT: ncat NECK: supple w/o LA CV: rrr. PULM: ctab, no inc wob ABD: soft, +bs EXT: no edema SKIN: no acute rash L greater troch ttp but normal L hip rom.  Able to bear weight.

## 2020-12-24 NOTE — Patient Instructions (Addendum)
Please call Dr. Dorothey Baseman office about follow up.  Let me know if you have trouble getting an appointment.  Go to the lab on the way out.   If you have mychart we'll likely use that to update you.    Take care.  Glad to see you.  You can call for a mammogram at [facility] at [facility].  [place] with your insurance to see if they will cover the shingles shot. I would get a Tdap at the pharmacy.    Try icing your left hip for 5 minutes at a time with fabric between you and the ice.  Likely bursitis.

## 2020-12-25 LAB — HEPATITIS C ANTIBODY
Hepatitis C Ab: NONREACTIVE
SIGNAL TO CUT-OFF: 0.03 (ref <1.00)

## 2020-12-25 LAB — HIV ANTIBODY (ROUTINE TESTING W REFLEX): HIV 1&2 Ab, 4th Generation: NONREACTIVE

## 2020-12-26 DIAGNOSIS — M719 Bursopathy, unspecified: Secondary | ICD-10-CM | POA: Insufficient documentation

## 2020-12-26 NOTE — Assessment & Plan Note (Signed)
Living will d/w pt.  Daughter designated if patient were incapacitated.

## 2020-12-26 NOTE — Assessment & Plan Note (Signed)
Stressors noted, see above.  Reasonable to continue with sertraline.  She will update me as needed.  Okay for outpatient follow-up.

## 2020-12-26 NOTE — Assessment & Plan Note (Signed)
See notes on labs. 

## 2020-12-26 NOTE — Assessment & Plan Note (Signed)
Likely greater trochanteric bursitis.  Reasonable to ice as needed for 5 minutes at a time and update me as needed.  Anatomy discussed with patient.  She agrees to plan.  Okay for outpatient follow-up.

## 2020-12-26 NOTE — Assessment & Plan Note (Signed)
Continue PPI ?

## 2020-12-26 NOTE — Assessment & Plan Note (Signed)
Tetanus 2012, d/w pt. Flu 2022 Shingles d/w pt.   PNA not due covid vaccine 2021 Smoking d/w pt.  she is now vaping, d/w pt.   Mammogram d/w pt.  She can call for appointment, see avs.   DXA not due, d/w pt.   Pap not due.  S/p hysterectomy.   Colonoscopy 2019 Living will d/w pt.  Daughter designated if patient were incapacitated.   Pt opts in for HIV screening.  D/w pt re: routine screening.   Pt opts in for HCV screening.  D/w pt re: routine screening.

## 2020-12-31 ENCOUNTER — Other Ambulatory Visit: Payer: Self-pay | Admitting: Family Medicine

## 2020-12-31 MED ORDER — FISH OIL 1000 MG PO CAPS: 1000.0000 mg | ORAL_CAPSULE | Freq: Two times a day (BID) | ORAL | Status: AC

## 2021-03-23 ENCOUNTER — Encounter: Payer: Self-pay | Admitting: Pharmacy Technician

## 2021-03-23 ENCOUNTER — Other Ambulatory Visit: Payer: Self-pay

## 2021-03-23 ENCOUNTER — Emergency Department
Admission: EM | Admit: 2021-03-23 | Discharge: 2021-03-23 | Disposition: A | Discharge status: Home / Self Care | Payer: BC Managed Care – PPO | Attending: Emergency Medicine | Admitting: Emergency Medicine

## 2021-03-23 ENCOUNTER — Emergency Department: Payer: BC Managed Care – PPO

## 2021-03-23 DIAGNOSIS — R0789 Other chest pain: Secondary | ICD-10-CM | POA: Insufficient documentation

## 2021-03-23 DIAGNOSIS — Z5321 Procedure and treatment not carried out due to patient leaving prior to being seen by health care provider: Secondary | ICD-10-CM | POA: Diagnosis not present

## 2021-03-23 DIAGNOSIS — R42 Dizziness and giddiness: Secondary | ICD-10-CM | POA: Diagnosis not present

## 2021-03-23 DIAGNOSIS — R0600 Dyspnea, unspecified: Secondary | ICD-10-CM | POA: Diagnosis not present

## 2021-03-23 DIAGNOSIS — R519 Headache, unspecified: Secondary | ICD-10-CM | POA: Diagnosis not present

## 2021-03-23 DIAGNOSIS — R079 Chest pain, unspecified: Secondary | ICD-10-CM | POA: Diagnosis not present

## 2021-03-23 LAB — BASIC METABOLIC PANEL
Anion gap: 11 (ref 5–15)
BUN: 10 mg/dL (ref 6–20)
CO2: 19 mmol/L — ABNORMAL LOW (ref 22–32)
Calcium: 9.1 mg/dL (ref 8.9–10.3)
Chloride: 102 mmol/L (ref 98–111)
Creatinine, Ser: 0.9 mg/dL (ref 0.44–1.00)
GFR, Estimated: >60 mL/min (ref >60)
Glucose, Bld: 109 mg/dL — ABNORMAL HIGH (ref 70–99)
Potassium: 3.9 mmol/L (ref 3.5–5.1)
Sodium: 132 mmol/L — ABNORMAL LOW (ref 135–145)

## 2021-03-23 LAB — CBC
HCT: 37.5 % (ref 36.0–46.0)
Hemoglobin: 12.7 g/dL (ref 12.0–15.0)
MCH: 33.1 pg (ref 26.0–34.0)
MCHC: 33.9 g/dL (ref 30.0–36.0)
MCV: 97.7 fL (ref 80.0–100.0)
Platelets: 282 10*3/uL (ref 150–400)
RBC: 3.84 MIL/uL — ABNORMAL LOW (ref 3.87–5.11)
RDW: 12.8 % (ref 11.5–15.5)
WBC: 10.3 10*3/uL (ref 4.0–10.5)
nRBC: 0 % (ref 0.0–0.2)

## 2021-03-23 LAB — TROPONIN I (HIGH SENSITIVITY): Troponin I (High Sensitivity): 4 ng/L (ref <18)

## 2021-03-23 MED ORDER — LORAZEPAM 1 MG PO TABS
1.0000 mg | ORAL_TABLET | Freq: Once | ORAL | Status: AC
  Administered 2021-03-23: 1 mg via ORAL
  Filled 2021-03-23: qty 1

## 2021-03-23 NOTE — ED Triage Notes (Signed)
Pt here with reports of chest tightness/heaviness and difficulty breathing approx 3 hours pta. Pt also endorses feeling lightheaded. Headache onset yesterday.

## 2021-03-23 NOTE — ED Provider Triage Note (Signed)
Emergency Medicine Provider Triage Evaluation Note  ELINORE SHULTS , a 51 y.o. female  was evaluated in triage.  Pt complains of chest tightness, pressure, difficulty breathing.  Symptoms for about 3 hours.  Feels it is getting worse.  Little lightheaded.  Patient had a headache yesterday.  Patient has history of hyperlipidemia, anxiety.  Review of Systems  Positive: Chest pain/pressure, shortness of breath Negative: Fever, chills  Physical Exam  BP (!) 158/89    Pulse 97    Temp 98.2 F (36.8 C) (Oral)    Resp 20    SpO2 98%  Gen:   Awake, no distress   Resp:  Normal effort  MSK:   Moves extremities without difficulty  Other:    Medical Decision Making  Medically screening exam initiated at 1:17 PM.  Appropriate orders placed.  KETARA CAVNESS was informed that the remainder of the evaluation will be completed by another provider, this initial triage assessment does not replace that evaluation, and the importance of remaining in the ED until their evaluation is complete.     Versie Starks, PA-C 03/23/21 1319

## 2021-03-24 ENCOUNTER — Telehealth: Payer: Self-pay | Admitting: Family Medicine

## 2021-03-24 NOTE — Telephone Encounter (Signed)
Please get update on patient after ER eval.  Thanks.  

## 2021-03-25 ENCOUNTER — Other Ambulatory Visit: Payer: Self-pay

## 2021-03-25 ENCOUNTER — Encounter: Payer: Self-pay | Admitting: Emergency Medicine

## 2021-03-25 ENCOUNTER — Emergency Department: Payer: BC Managed Care – PPO

## 2021-03-25 ENCOUNTER — Emergency Department
Admission: EM | Admit: 2021-03-25 | Discharge: 2021-03-25 | Disposition: A | Discharge status: Home / Self Care | Payer: BC Managed Care – PPO | Attending: Emergency Medicine | Admitting: Emergency Medicine

## 2021-03-25 DIAGNOSIS — R0789 Other chest pain: Secondary | ICD-10-CM | POA: Diagnosis not present

## 2021-03-25 DIAGNOSIS — R079 Chest pain, unspecified: Secondary | ICD-10-CM

## 2021-03-25 DIAGNOSIS — K2100 Gastro-esophageal reflux disease with esophagitis, without bleeding: Secondary | ICD-10-CM | POA: Diagnosis not present

## 2021-03-25 DIAGNOSIS — R0602 Shortness of breath: Secondary | ICD-10-CM | POA: Diagnosis not present

## 2021-03-25 DIAGNOSIS — R072 Precordial pain: Secondary | ICD-10-CM | POA: Diagnosis not present

## 2021-03-25 LAB — BASIC METABOLIC PANEL
Anion gap: 9 (ref 5–15)
BUN: 8 mg/dL (ref 6–20)
CO2: 22 mmol/L (ref 22–32)
Calcium: 9.4 mg/dL (ref 8.9–10.3)
Chloride: 104 mmol/L (ref 98–111)
Creatinine, Ser: 0.91 mg/dL (ref 0.44–1.00)
GFR, Estimated: >60 mL/min (ref >60)
Glucose, Bld: 118 mg/dL — ABNORMAL HIGH (ref 70–99)
Potassium: 4.2 mmol/L (ref 3.5–5.1)
Sodium: 135 mmol/L (ref 135–145)

## 2021-03-25 LAB — CBC
HCT: 39.5 % (ref 36.0–46.0)
Hemoglobin: 13.6 g/dL (ref 12.0–15.0)
MCH: 33.7 pg (ref 26.0–34.0)
MCHC: 34.4 g/dL (ref 30.0–36.0)
MCV: 97.8 fL (ref 80.0–100.0)
Platelets: 275 10*3/uL (ref 150–400)
RBC: 4.04 MIL/uL (ref 3.87–5.11)
RDW: 12.5 % (ref 11.5–15.5)
WBC: 10 10*3/uL (ref 4.0–10.5)
nRBC: 0 % (ref 0.0–0.2)

## 2021-03-25 LAB — TROPONIN I (HIGH SENSITIVITY)
Troponin I (High Sensitivity): 3 ng/L (ref <18)
Troponin I (High Sensitivity): 3 ng/L (ref <18)

## 2021-03-25 LAB — D-DIMER, QUANTITATIVE: D-Dimer, Quant: 1.93 ug/mL-FEU — ABNORMAL HIGH (ref 0.00–0.50)

## 2021-03-25 MED ORDER — IOHEXOL 350 MG/ML SOLN
75.0000 mL | Freq: Once | INTRAVENOUS | Status: AC | PRN
  Administered 2021-03-25: 75 mL via INTRAVENOUS
  Filled 2021-03-25: qty 75

## 2021-03-25 MED ORDER — SUCRALFATE 1 G PO TABS: 1.0000 g | ORAL_TABLET | Freq: Four times a day (QID) | ORAL | 0 refills | Status: DC

## 2021-03-25 NOTE — ED Notes (Signed)
See triage note  presents with some chest discomfort and feels like it is hard to breath  afebrile on arrival

## 2021-03-25 NOTE — Telephone Encounter (Signed)
[place] [facility] - Client TELEPHONE ADVICE RECORD AccessNurse Patient Name: Krystal Mercado [facility] Gender: Female DOB: 12-Jan-1971 Age: 51 Y 3 M 18 D Return Phone Number: [phone] (Primary) Address: City/ State/ Zip: [place]  [address] Client [place] [facility] - Client Client Site [place] - Day Provider Renford Dills - MD Contact Type Call Who Is Calling Patient / Member / Family / Caregiver Call Type Triage / Clinical Relationship To Patient Self Return Phone Number [phone] (Primary) Chief Complaint BREATHING - shortness of breath or sounds breathless Reason for Call Symptomatic / Request for [place] states she is having shortness of breath and chest heaviness. Translation No Nurse Assessment Nurse: Humfleet, RN, Estill Bamberg Date/Time (Eastern Time): 03/25/2021 10:30:13 AM Confirm and document reason for call. If symptomatic, describe symptoms. ---caller states her chest feels tight and has trouble catching her breath like an upper resp infection without the cold. went to ER sat due to same symptoms and being pale. had EKG, chest xray, blood work but sat there for hours and she left and went home. she had been on antibiotics for tooth infection that went into her sinuses. then she was on antibiotics for UTI. Does the patient have any new or worsening symptoms? ---Yes Will a triage be completed? ---Yes Related visit to physician within the last 2 weeks? ---Yes Does the PT have any chronic conditions? (i.e. diabetes, asthma, this includes High risk factors for pregnancy, etc.) ---Yes List chronic conditions. ---anxiety-zoloft, acid reflux- pantoprazole Is the patient pregnant or possibly pregnant? (Ask all females between the ages of 27-55) ---No Is this a behavioral health or substance abuse call? ---No Guidelines Guideline Title Affirmed Question Affirmed Notes  Nurse Date/Time (Eastern Time) Breathing Difficulty History of prior "blood clot" in leg or lungs (i.e., deep Humfleet, RN, Estill Bamberg 03/25/2021 10:32:01 AM PLEASE NOTE: All timestamps contained within this report are represented as [place] Standard Time. CONFIDENTIALTY NOTICE: This fax transmission is intended only for the addressee. It contains information that is legally privileged, confidential or otherwise protected from use or disclosure. If you are not the intended recipient, you are strictly prohibited from reviewing, disclosing, copying using or disseminating any of this information or taking any action in reliance on or regarding this information. If you have received this fax in error, please notify us immediately by telephone so that we can arrange for its return to [place]. Phone: [phone], Toll-Free: [phone], Fax: [phone] Page: 2 of 2 Call Id: [id number] Guidelines Guideline Title Affirmed Question Affirmed Notes Nurse Date/Time Eilene Ghazi Time) vein thrombosis, pulmonary embolism) Disp. Time Eilene Ghazi Time) Disposition Final User 03/25/2021 10:29:28 AM Send to Urgent Queue Uvaldo Rising 03/25/2021 10:36:39 AM Go to ED Now Yes Humfleet, RN, Shelly Coss Disagree/Comply Comply Caller Understands Yes PreDisposition InappropriateToAsk Care Advice Given Per Guideline GO TO ED NOW: * You need to be seen in the Emergency Department. * Go to the ED at ___________ [place] now. Drive carefully. CARE ADVICE given per Breathing Difficulty (Adult) guideline. * Bring a list of your current medicines when you go to the Emergency Department (ER). BRING MEDICINES: NOTE TO TRIAGER - DRIVING: * Another adult should drive. * Patient should not delay going to the emergency department. Referrals [place]

## 2021-03-25 NOTE — ED Provider Notes (Signed)
[facility] Provider Note    Event Date/Time   First MD Initiated Contact with Patient 03/25/21 1306     (approximate)   History   Chest Pain and Shortness of Breath   HPI NYIAH PIANKA is a 51 y.o. female with a stated past medical history of Barrett's esophagus who presents for substernal chest pain with associated shortness of breath.  Patient states that this pain gets worse when she takes a deep breath.  Patient denies any exertional worsening of this pain and nor does this pain decrease when she is at rest.  Patient states that she has been taking all of her medications on time and as prescribed.  Patient denies any diaphoresis, left arm/neck pain, or lightheadedness/syncope     Physical Exam   Triage Vital Signs: ED Triage Vitals  Enc Vitals Group     BP 03/25/21 1128 (!) 141/103     Pulse Rate 03/25/21 1128 100     Resp 03/25/21 1128 20     Temp 03/25/21 1128 97.6 F (36.4 C)     Temp Source 03/25/21 1128 Oral     SpO2 03/25/21 1128 96 %     Weight 03/25/21 1126 130 lb (59 kg)     Height 03/25/21 1126 5\' 4"  (1.626 m)     Head Circumference --      Peak Flow --      Pain Score 03/25/21 1125 5     Pain Loc --      Pain Edu? --      Excl. in [place]? --     Most recent vital signs: Vitals:   03/25/21 1128 03/25/21 1436  BP: (!) 141/103 (!) 130/98  Pulse: 100 89  Resp: 20 18  Temp: 97.6 F (36.4 C)   SpO2: 96% 97%    General: Awake, no distress.  CV:  Good peripheral perfusion.  Resp:  Normal effort.  Abd:  No distention.  Other:  Middle-aged Caucasian female resting comfortably in stretcher   ED Results / Procedures / Treatments   Labs (all labs ordered are listed, but only abnormal results are displayed) Labs Reviewed  BASIC METABOLIC PANEL - Abnormal; Notable for the following components:      Result Value   Glucose, Bld 118 (*)    All other components within normal limits  D-DIMER, QUANTITATIVE - Abnormal; Notable for  the following components:   D-Dimer, Quant 1.93 (*)    All other components within normal limits  CBC  TROPONIN I (HIGH SENSITIVITY)  TROPONIN I (HIGH SENSITIVITY)     EKG ED ECG REPORT I, Naaman Plummer, the attending physician, personally viewed and interpreted this ECG.  Date: 03/25/2021 EKG Time: 1125 Rate: 101 Rhythm: Tachycardic sinus rhythm QRS Axis: normal Intervals: normal ST/T Wave abnormalities: normal Narrative Interpretation: Tachycardic sinus rhythm.  No evidence of acute ischemia   RADIOLOGY ED MD interpretation: CT angiography of the chest shows evidence of symmetric esophageal wall thickening likely reflecting esophagitis, scattered groundglass opacities in the bilateral lung bases, and no evidence of of pulmonary embolism  Official radiology report(s): CT Angio Chest PE W/Cm &/Or Wo Cm  Result Date: 03/25/2021 CLINICAL DATA:  Generalized chest tightness and shortness of breath since this weekend. EXAM: CT ANGIOGRAPHY CHEST WITH CONTRAST TECHNIQUE: Multidetector CT imaging of the chest was performed using the standard protocol during bolus administration of intravenous contrast. Multiplanar CT image reconstructions and MIPs were obtained to evaluate the vascular anatomy. CONTRAST:  49mL OMNIPAQUE IOHEXOL 350 MG/ML SOLN COMPARISON:  Chest radiograph March 23, 2021 FINDINGS: Cardiovascular: Satisfactory opacification of the pulmonary arteries to the segmental level. No evidence of pulmonary embolism. Aortic atherosclerosis without thoracic aortic aneurysm. Normal heart size. No pericardial effusion. Mediastinum/Nodes: No discrete thyroid nodule. No pathologically enlarged mediastinal, axillary lymph nodes. Mild symmetric esophageal wall thickening, nonspecific possibly reflecting esophagitis. Lungs/Pleura: Mild biapical pleuroparenchymal scarring. Scattered patchy ground-glass opacities in the bilateral lung bases, likely infectious or inflammatory. No pleural effusion. No  pneumothorax. Upper Abdomen: No acute abnormality. Musculoskeletal: No chest wall abnormality. No acute or significant osseous findings. Review of the MIP images confirms the above findings. IMPRESSION: 1. No evidence of pulmonary embolism. 2. Scattered patchy ground-glass opacities in the bilateral lung bases, likely infectious or inflammatory. 3. Mild symmetric esophageal wall thickening, nonspecific possibly reflecting esophagitis. 4.  Aortic Atherosclerosis (ICD10-I70.0). Electronically Signed   By: Dahlia Bailiff M.D.   On: 03/25/2021 14:24      PROCEDURES:  Critical Care performed: No  Procedures   MEDICATIONS ORDERED IN ED: Medications  iohexol (OMNIPAQUE) 350 MG/ML injection 75 mL (75 mLs Intravenous Contrast Given 03/25/21 1410)     IMPRESSION / MDM / ASSESSMENT AND PLAN / ED COURSE  I reviewed the triage vital signs and the nursing notes.                              The patient is on the cardiac monitor to evaluate for evidence of arrhythmia and/or significant heart rate changes.  Workup: ECG, CXR, CBC, BMP, Troponin, D-dimer Findings: ECG: No overt evidence of STEMI. No evidence of Brugadas sign, delta wave, epsilon wave, significantly prolonged QTc, or malignant arrhythmia HS Troponin: Negative x1 D-dimer: 1.93 CT angiography: No evidence of PE, evidence of esophagitis and bilateral lower lung atelectasis versus infiltrate As patient has no complaints of cough, fever, or leukocytosis, lung findings likely atelectasis and lower suspicion for infiltrate Other Labs unremarkable for emergent problems. CXR: Without PTX, PNA, or widened mediastinum Last Stress Test: Never Last Heart Catheterization: Never HEART Score: 3  Given History, Exam, and Workup I have low suspicion for ACS, Pneumothorax, Pneumonia, Pulmonary Embolus, Tamponade, Aortic Dissection or other emergent problem as a cause for this presentation.   Reassesment: Prior to discharge patients pain was  controlled and they were well appearing.  Disposition:  Discharge. Strict return precautions discussed with patient with full understanding. Advised patient to follow up promptly with primary care provider      FINAL CLINICAL IMPRESSION(S) / ED DIAGNOSES   Final diagnoses:  Gastroesophageal reflux disease with esophagitis without hemorrhage  Chest pain, unspecified type     Rx / DC Orders   ED Discharge Orders          Ordered    sucralfate (CARAFATE) 1 g tablet  4 times daily        03/25/21 1515             Note:  This document was prepared using Dragon voice recognition software and may include unintentional dictation errors.   Naaman Plummer, MD 03/25/21 [phone]

## 2021-03-25 NOTE — ED Triage Notes (Signed)
Pt to ED for centralized chest tightness and shob that started over the weekend.  NAD noted.  Denies n/v  Hx of blood clots

## 2021-03-25 NOTE — Telephone Encounter (Signed)
Noted. Will await the ER report.  Thanks.  

## 2021-03-25 NOTE — Telephone Encounter (Signed)
See note below access note that Janett Billow CMA spoke with pt. Per chart review tab pt is presently at [facility] ED. Sending note to Dr Damita Dunnings and Janett Billow CMA.

## 2021-03-25 NOTE — Telephone Encounter (Signed)
Called patient to get update on her and she stated she was doing the same as before and was intrusted by another person at our office to go back to the ED. Patient was on her way there now and I advised her I would update Dr. Damita Dunnings and await updated ED notes.

## 2021-03-25 NOTE — ED Provider Triage Note (Signed)
Emergency Medicine Provider Triage Evaluation Note  Krystal Mercado , a 51 y.o. female  was evaluated in triage.  Pt complains of central chest tightness and shortness of breath since Saturday. Came on 03/23/21 for the same, but left before being seen..Symptoms have not improved. PCP sent her here for further evaluation.  Review of Systems  Positive: Chest tightness, shortness of breath Negative: Fever  Physical Exam  BP (!) 141/103    Pulse 100    Resp 20    Ht 5\' 4"  (1.626 m)    Wt 59 kg    SpO2 96%    BMI 22.31 kg/m  Gen:   Awake, no distress   Resp:  Normal effort  MSK:   Moves extremities without difficulty  Other:    Medical Decision Making  Medically screening exam initiated at 11:29 AM.  Appropriate orders placed.  Krystal Mercado was informed that the remainder of the evaluation will be completed by another provider, this initial triage assessment does not replace that evaluation, and the importance of remaining in the ED until their evaluation is complete.   Victorino Dike, FNP 03/25/21 1159

## 2021-03-26 ENCOUNTER — Telehealth: Payer: Self-pay | Admitting: Family Medicine

## 2021-03-26 NOTE — Telephone Encounter (Signed)
LMTCB

## 2021-03-26 NOTE — Telephone Encounter (Signed)
Please get update on patient.  Thanks. 

## 2021-03-29 ENCOUNTER — Ambulatory Visit: Payer: BC Managed Care – PPO | Admitting: Family Medicine

## 2021-03-29 NOTE — Telephone Encounter (Signed)
Patient was supposed to have f/u appt with Dr. Damita Dunnings 03/29/21 and cancelled; lmtcb to see how she is doing.

## 2021-04-02 NOTE — Telephone Encounter (Signed)
Pt returning your call. Pt states that she is doing well.

## 2021-04-02 NOTE — Telephone Encounter (Signed)
LMTCB; trying to get update on how patient is doing

## 2021-04-22 ENCOUNTER — Telehealth (INDEPENDENT_AMBULATORY_CARE_PROVIDER_SITE_OTHER): Payer: BC Managed Care – PPO | Admitting: Nurse Practitioner

## 2021-04-22 ENCOUNTER — Other Ambulatory Visit: Payer: Self-pay

## 2021-04-22 ENCOUNTER — Encounter: Payer: Self-pay | Admitting: Nurse Practitioner

## 2021-04-22 DIAGNOSIS — J3489 Other specified disorders of nose and nasal sinuses: Secondary | ICD-10-CM | POA: Diagnosis not present

## 2021-04-22 DIAGNOSIS — G4489 Other headache syndrome: Secondary | ICD-10-CM | POA: Diagnosis not present

## 2021-04-22 DIAGNOSIS — J019 Acute sinusitis, unspecified: Secondary | ICD-10-CM

## 2021-04-22 DIAGNOSIS — R509 Fever, unspecified: Secondary | ICD-10-CM | POA: Diagnosis not present

## 2021-04-22 LAB — POC COVID19 BINAXNOW: SARS Coronavirus 2 Ag: NEGATIVE

## 2021-04-22 MED ORDER — AMOXICILLIN-POT CLAVULANATE 875-125 MG PO TABS: 1.0000 | ORAL_TABLET | Freq: Two times a day (BID) | ORAL | 0 refills | Status: DC

## 2021-04-22 NOTE — Assessment & Plan Note (Signed)
Patient has been having symptoms for approximately 2 weeks.  Symptoms got better and then returned at home COVID test was negative.  Patient did come in and get rapid COVID test in office which was also negative today.  Given length of symptoms and negative COVID test will likely treat for sinusitis.  Start Augmentin 875-125 mg twice daily for 7 days.  Follow-up if no improvement

## 2021-04-22 NOTE — Progress Notes (Signed)
Patient ID: Krystal Mercado, female    DOB: Jul 08, 1970, 51 y.o.   MRN: [id number]  Virtual visit completed through [place], a video enabled telemedicine application. Due to national recommendations of social distancing due to COVID-19, a virtual visit is felt to be most appropriate for this patient at this time. Reviewed limitations, risks, security and privacy concerns of performing a virtual visit and the availability of in person appointments. I also reviewed that there may be a patient responsible charge related to this service. The patient agreed to proceed.   Patient location: home Provider location: [place] at [facility], office Persons participating in this virtual visit: patient, provider   If any vitals were documented, they were collected by patient at home unless specified below.    There were no vitals taken for this visit.   CC: Sinus problem Subjective:   HPI: Krystal Mercado is a 51 y.o. female presenting on 04/22/2021 for Sinus Problem (Sx started about 2 weeks ago with a cold type of symptoms, then started with the following symptoms on 04/20/21-A lot of sinus pain and pressure behind her eyes, stuffy nose, swelling and pain around her eyebrows and her nose, post nasal drip, cough, has had some chills/sweats, headache. No sore throat. Covid test negative on 04/21/21. Has taking Mucinex, Theraflu, Afrin spray, Tylenol, a few goodie powders.)  Symptoms started approx 2 weeks ago. States that her sympotms were improving and then came back 2 dayts ago Moderna x2 vaccine Covid test at home yesterayt was negative Boyfriend had a cold but he has recovered  Has been using Afrin, mucinex, theraflu, tylenol with little relief     Relevant past medical, surgical, family and social history reviewed and updated as indicated. Interim medical history since our last visit reviewed. Allergies and medications reviewed and updated. Outpatient Medications Prior to Visit  Medication  Sig Dispense Refill   Multiple Vitamin (MULTIVITAMIN) tablet Take 1 tablet by mouth daily.     Omega-3 Fatty Acids (FISH OIL) 1000 MG CAPS Take 1 capsule (1,000 mg total) by mouth 2 (two) times daily.     pantoprazole (PROTONIX) 40 MG tablet TAKE 1 TABLET BY MOUTH EVERY DAY 90 tablet 1   sertraline (ZOLOFT) 100 MG tablet Take 1.5 tablets (150 mg total) by mouth daily. 135 tablet 1   sucralfate (CARAFATE) 1 g tablet Take 1 tablet (1 g total) by mouth 4 (four) times daily for 7 days. 28 tablet 0   No facility-administered medications prior to visit.     Per HPI unless specifically indicated in ROS section below Review of Systems  Constitutional:  Positive for appetite change, chills, fatigue and fever.  HENT:  Positive for congestion, postnasal drip, sinus pressure and sinus pain. Negative for ear discharge, ear pain and sore throat.   Respiratory:  Positive for cough. Negative for shortness of breath.   Cardiovascular:  Negative for chest pain.  Gastrointestinal:  Positive for diarrhea and vomiting. Negative for abdominal pain and nausea.  Musculoskeletal:  Negative for arthralgias and myalgias.  Neurological:  Positive for headaches.  Objective:  There were no vitals taken for this visit.  Wt Readings from Last 3 Encounters:  03/25/21 130 lb (59 kg)  12/24/20 130 lb (59 kg)  11/08/19 130 lb 1 oz (59 kg)       Physical exam: Gen: alert, NAD, not ill appearing Pulm: speaks in complete sentences without increased work of breathing Psych: normal mood, normal thought content  Results for orders placed or performed during the hospital encounter of 55/73/22  Basic metabolic panel  Result Value Ref Range   Sodium 135 135 - 145 mmol/L   Potassium 4.2 3.5 - 5.1 mmol/L   Chloride 104 98 - 111 mmol/L   CO2 22 22 - 32 mmol/L   Glucose, Bld 118 (H) 70 - 99 mg/dL   BUN 8 6 - 20 mg/dL   Creatinine, Ser 0.91 0.44 - 1.00 mg/dL   Calcium 9.4 8.9 - 10.3 mg/dL   GFR, Estimated >60 >60  mL/min   Anion gap 9 5 - 15  CBC  Result Value Ref Range   WBC 10.0 4.0 - 10.5 K/uL   RBC 4.04 3.87 - 5.11 MIL/uL   Hemoglobin 13.6 12.0 - 15.0 g/dL   HCT 39.5 36.0 - 46.0 %   MCV 97.8 80.0 - 100.0 fL   MCH 33.7 26.0 - 34.0 pg   MCHC 34.4 30.0 - 36.0 g/dL   RDW 12.5 11.5 - 15.5 %   Platelets 275 150 - 400 K/uL   nRBC 0.0 0.0 - 0.2 %  D-dimer, quantitative  Result Value Ref Range   D-Dimer, Quant 1.93 (H) 0.00 - 0.50 ug/mL-FEU  Troponin I (High Sensitivity)  Result Value Ref Range   Troponin I (High Sensitivity) 3 <18 ng/L  Troponin I (High Sensitivity)  Result Value Ref Range   Troponin I (High Sensitivity) 3 <18 ng/L   Assessment & Plan:   Problem List Items Addressed This Visit       Respiratory   Acute non-recurrent sinusitis    Patient has been having symptoms for approximately 2 weeks.  Symptoms got better and then returned at home COVID test was negative.  Patient did come in and get rapid COVID test in office which was also negative today.  Given length of symptoms and negative COVID test will likely treat for sinusitis.  Start Augmentin 875-125 mg twice daily for 7 days.  Follow-up if no improvement      Relevant Medications   amoxicillin-clavulanate (AUGMENTIN) 875-125 MG tablet   Other Visit Diagnoses     Sinus pressure    -  Primary   Relevant Orders   POC COVID-19 (Completed)   Fever and chills       Relevant Orders   POC COVID-19 (Completed)   Other headache syndrome       Relevant Orders   POC COVID-19 (Completed)         Meds ordered this encounter  Medications   amoxicillin-clavulanate (AUGMENTIN) 875-125 MG tablet    Sig: Take 1 tablet by mouth 2 (two) times daily.    Dispense:  20 tablet    Refill:  0    Order Specific Question:   Supervising Provider    Answer:   Loura Pardon A [[id number]]   Orders Placed This Encounter  Procedures   POC COVID-19    Standing Status:   Future    Number of Occurrences:   1    Standing Expiration Date:    04/29/2021    Order Specific Question:   Previously tested for COVID-19    Answer:   Yes    Order Specific Question:   Resident in a congregate (group) care setting    Answer:   No    Order Specific Question:   Employed in healthcare setting    Answer:   No    Order Specific Question:   Pregnant    Answer:  No    I discussed the assessment and treatment plan with the patient. The patient was provided an opportunity to ask questions and all were answered. The patient agreed with the plan and demonstrated an understanding of the instructions. The patient was advised to call back or seek an in-person evaluation if the symptoms worsen or if the condition fails to improve as anticipated.  Follow up plan: No follow-ups on file.  Romilda Garret, NP

## 2021-05-15 ENCOUNTER — Encounter: Payer: Self-pay | Admitting: Gastroenterology

## 2021-05-16 ENCOUNTER — Encounter: Payer: Self-pay | Admitting: Gastroenterology

## 2021-05-16 ENCOUNTER — Other Ambulatory Visit: Payer: Self-pay | Admitting: Gastroenterology

## 2021-05-16 DIAGNOSIS — R194 Change in bowel habit: Secondary | ICD-10-CM

## 2021-05-16 DIAGNOSIS — K2270 Barrett's esophagus without dysplasia: Secondary | ICD-10-CM

## 2021-05-16 MED ORDER — NA SULFATE-K SULFATE-MG SULF 17.5-3.13-1.6 GM/177ML PO SOLN: 1.0000 | Freq: Once | ORAL | 0 refills | Status: AC

## 2021-05-20 ENCOUNTER — Encounter: Payer: Self-pay | Admitting: Gastroenterology

## 2021-05-20 ENCOUNTER — Other Ambulatory Visit: Payer: Self-pay

## 2021-05-20 ENCOUNTER — Ambulatory Visit
Admission: RE | Admit: 2021-05-20 | Discharge: 2021-05-20 | Disposition: A | Discharge status: Home / Self Care | Payer: BC Managed Care – PPO | Attending: Gastroenterology | Admitting: Gastroenterology

## 2021-05-20 ENCOUNTER — Encounter
Admission: RE | Disposition: A | Discharge status: Home / Self Care | Payer: Self-pay | Source: Home / Self Care | Attending: Gastroenterology

## 2021-05-20 ENCOUNTER — Ambulatory Visit: Payer: BC Managed Care – PPO | Admitting: Anesthesiology

## 2021-05-20 DIAGNOSIS — Z860100 Personal history of colon polyps, unspecified: Secondary | ICD-10-CM

## 2021-05-20 DIAGNOSIS — K635 Polyp of colon: Secondary | ICD-10-CM | POA: Diagnosis not present

## 2021-05-20 DIAGNOSIS — R194 Change in bowel habit: Secondary | ICD-10-CM

## 2021-05-20 DIAGNOSIS — K219 Gastro-esophageal reflux disease without esophagitis: Secondary | ICD-10-CM | POA: Insufficient documentation

## 2021-05-20 DIAGNOSIS — Z1211 Encounter for screening for malignant neoplasm of colon: Secondary | ICD-10-CM | POA: Diagnosis not present

## 2021-05-20 DIAGNOSIS — K641 Second degree hemorrhoids: Secondary | ICD-10-CM | POA: Insufficient documentation

## 2021-05-20 DIAGNOSIS — K2270 Barrett's esophagus without dysplasia: Secondary | ICD-10-CM | POA: Insufficient documentation

## 2021-05-20 DIAGNOSIS — D125 Benign neoplasm of sigmoid colon: Secondary | ICD-10-CM | POA: Diagnosis not present

## 2021-05-20 DIAGNOSIS — Z09 Encounter for follow-up examination after completed treatment for conditions other than malignant neoplasm: Secondary | ICD-10-CM | POA: Diagnosis not present

## 2021-05-20 DIAGNOSIS — F419 Anxiety disorder, unspecified: Secondary | ICD-10-CM | POA: Insufficient documentation

## 2021-05-20 DIAGNOSIS — K6389 Other specified diseases of intestine: Secondary | ICD-10-CM | POA: Insufficient documentation

## 2021-05-20 DIAGNOSIS — I7300 Raynaud's syndrome without gangrene: Secondary | ICD-10-CM | POA: Insufficient documentation

## 2021-05-20 HISTORY — PX: POLYPECTOMY: SHX5525

## 2021-05-20 HISTORY — PX: ESOPHAGOGASTRODUODENOSCOPY: SHX5428

## 2021-05-20 HISTORY — PX: COLONOSCOPY: SHX5424

## 2021-05-20 SURGERY — COLONOSCOPY
Anesthesia: General | Site: Rectum

## 2021-05-20 MED ORDER — SODIUM CHLORIDE 0.9 % IV SOLN: INTRAVENOUS | Status: DC

## 2021-05-20 MED ORDER — LACTATED RINGERS IV SOLN: INTRAVENOUS | Status: DC

## 2021-05-20 MED ORDER — ACETAMINOPHEN 160 MG/5ML PO SOLN: 325.0000 mg | Freq: Once | ORAL | Status: DC

## 2021-05-20 MED ORDER — PROPOFOL 10 MG/ML IV BOLUS
INTRAVENOUS | Status: DC | PRN
Start: 2021-05-20 — End: 2021-05-20
  Administered 2021-05-20 (×3): 40 mg via INTRAVENOUS
  Administered 2021-05-20: 30 mg via INTRAVENOUS
  Administered 2021-05-20: 50 mg via INTRAVENOUS
  Administered 2021-05-20: 40 mg via INTRAVENOUS
  Administered 2021-05-20: 150 mg via INTRAVENOUS
  Administered 2021-05-20 (×3): 40 mg via INTRAVENOUS
  Administered 2021-05-20: 50 mg via INTRAVENOUS
  Administered 2021-05-20 (×3): 40 mg via INTRAVENOUS
  Administered 2021-05-20: 50 mg via INTRAVENOUS

## 2021-05-20 MED ORDER — STERILE WATER FOR IRRIGATION IR SOLN
Status: DC | PRN
Start: 2021-05-20 — End: 2021-05-20
  Administered 2021-05-20 (×2): 1

## 2021-05-20 MED ORDER — ACETAMINOPHEN 325 MG PO TABS: 325.0000 mg | ORAL_TABLET | Freq: Once | ORAL | Status: DC

## 2021-05-20 MED ORDER — STERILE WATER FOR IRRIGATION IR SOLN
Status: DC | PRN
  Administered 2021-05-20: 60 mL

## 2021-05-20 MED ORDER — LIDOCAINE HCL (CARDIAC) PF 100 MG/5ML IV SOSY
PREFILLED_SYRINGE | INTRAVENOUS | Status: DC | PRN
  Administered 2021-05-20: 30 mg via INTRAVENOUS

## 2021-05-20 SURGICAL SUPPLY — 9 items
FORCEPS BIOP RAD 4 LRG CAP 4 (CUTTING FORCEPS) ×1 IMPLANT
GOWN CVR UNV OPN BCK APRN NK (MISCELLANEOUS) ×6 IMPLANT
GOWN ISOL THUMB LOOP REG UNIV (MISCELLANEOUS) ×6
KIT PRC NS LF DISP ENDO (KITS) ×3 IMPLANT
KIT PROCEDURE OLYMPUS (KITS) ×3
MANIFOLD NEPTUNE II (INSTRUMENTS) ×4 IMPLANT
SNARE COLD EXACTO (MISCELLANEOUS) ×1 IMPLANT
TRAP ETRAP POLY (MISCELLANEOUS) ×1 IMPLANT
WATER STERILE IRR 250ML POUR (IV SOLUTION) ×4 IMPLANT

## 2021-05-20 NOTE — Op Note (Signed)
[facility] ?Gastroenterology ?Patient Name: Krystal Mercado ?Procedure Date: 05/20/2021 7:10 AM ?MRN: [id number] ?Account #: [id number] ?Date of Birth: 1970-09-13 ?Admit Type: Outpatient ?Age: 51 ?Room: [facility] OR ROOM 01 ?Gender: Female ?Note Status: Finalized ?Instrument Name: [id number] ?Procedure:             Colonoscopy ?Indications:           High risk colon cancer surveillance: Personal history  ?                       of colonic polyps ?Providers:             Lucilla Lame MD, MD ?Medicines:             Propofol per Anesthesia ?Complications:         No immediate complications. ?Procedure:             Pre-Anesthesia Assessment: ?                       - Prior to the procedure, a History and Physical was  ?                       performed, and patient medications and allergies were  ?                       reviewed. The patient's tolerance of previous  ?                       anesthesia was also reviewed. The risks and benefits  ?                       of the procedure and the sedation options and risks  ?                       were discussed with the patient. All questions were  ?                       answered, and informed consent was obtained. Prior  ?                       Anticoagulants: The patient has taken no previous  ?                       anticoagulant or antiplatelet agents. ASA Grade  ?                       Assessment: II - A patient with mild systemic disease.  ?                       After reviewing the risks and benefits, the patient  ?                       was deemed in satisfactory condition to undergo the  ?                       procedure. ?                       After obtaining informed consent, the colonoscope was  ?  passed under direct vision. Throughout the procedure,  ?                       the patient's blood pressure, pulse, and oxygen  ?                       saturations were monitored continuously. The  ?                       Colonoscope was  introduced through the anus and  ?                       advanced to the the cecum, identified by appendiceal  ?                       orifice and ileocecal valve. The colonoscopy was  ?                       performed without difficulty. The patient tolerated  ?                       the procedure well. The quality of the bowel  ?                       preparation was excellent. ?Findings: ?     The perianal and digital rectal examinations were normal. ?     A 4 mm polyp was found in the sigmoid colon. The polyp was sessile. The  ?     polyp was removed with a cold snare. Resection and retrieval were  ?     complete. ?     A localized area of moderately erythematous mucosa was found in the  ?     sigmoid colon. This was biopsied with a cold forceps for histology. ?     Non-bleeding internal hemorrhoids were found during retroflexion. The  ?     hemorrhoids were Grade II (internal hemorrhoids that prolapse but reduce  ?     spontaneously). ?Impression:            - One 4 mm polyp in the sigmoid colon, removed with a  ?                       cold snare. Resected and retrieved. ?                       - Erythematous mucosa in the sigmoid colon. Biopsied. ?                       - Non-bleeding internal hemorrhoids. ?Recommendation:        - Discharge patient to home. ?                       - Resume previous diet. ?                       - Continue present medications. ?                       - Await pathology results. ?                       -  Repeat colonoscopy in 7 years for surveillance. ?Procedure Code(s):     --- Professional --- ?                       [phone], Colonoscopy, flexible; with removal of  ?                       tumor(s), polyp(s), or other lesion(s) by snare  ?                       technique ?                       45380, 59, Colonoscopy, flexible; with biopsy, single  ?                       or multiple ?Diagnosis Code(s):     --- Professional --- ?                       Z86.010, Personal history of  colonic polyps ?                       K63.5, Polyp of colon ?CPT copyright 2019 American Medical Association. All rights reserved. ?The codes documented in this report are preliminary and upon coder review may  ?be revised to meet current compliance requirements. ?Lucilla Lame MD, MD ?05/20/2021 12:19:35 PM ?This report has been signed electronically. ?Number of Addenda: 0 ?Note Initiated On: 05/20/2021 7:10 AM ?Scope Withdrawal Time: 0 hours 8 minutes 13 seconds  ?Total Procedure Duration: 0 hours 12 minutes 16 seconds  ?Estimated Blood Loss:  Estimated blood loss: none. ?     [facility] ?

## 2021-05-20 NOTE — Anesthesia Postprocedure Evaluation (Signed)
Anesthesia Post Note ? ?Patient: Krystal Mercado ? ?Procedure(s) Performed: COLONOSCOPYn WITH BIOPSY (Rectum) ?POLYPECTOMY (Rectum) ?ESOPHAGOGASTRODUODENOSCOPY (EGD) WITH BIOPSY (Mouth) ? ? ?  ?Patient location during evaluation: PACU ?Anesthesia Type: General ?Level of consciousness: awake and alert and oriented ?Pain management: satisfactory to patient ?Vital Signs Assessment: post-procedure vital signs reviewed and stable ?Respiratory status: spontaneous breathing, nonlabored ventilation and respiratory function stable ?Cardiovascular status: blood pressure returned to baseline and stable ?Postop Assessment: Adequate PO intake and No signs of nausea or vomiting ?Anesthetic complications: no ? ? ?No notable events documented. ? ?Raliegh Ip ? ? ? ? ? ?

## 2021-05-20 NOTE — Anesthesia Procedure Notes (Signed)
Date/Time: 05/20/2021 11:48 AM ?Performed by: Cameron Ali, CRNA ?Pre-anesthesia Checklist: Patient identified, Emergency Drugs available, Suction available, Timeout performed and Patient being monitored ?Patient Re-evaluated:Patient Re-evaluated prior to induction ?Oxygen Delivery Method: Nasal cannula ?Placement Confirmation: positive ETCO2 ? ? ? ? ?

## 2021-05-20 NOTE — H&P (Signed)
? ?Lucilla Lame, MD [facility] ?[address] ?[place], [place] [address] ?Phone:[phone] ?Fax : [phone] ? ?Primary Care Physician:  Tonia Ghent, MD ?Primary Gastroenterologist:  Dr. Allen Norris ? ?Pre-Procedure History & Physical: ?HPI:  Krystal Mercado is a 51 y.o. female is here for an endoscopy and colonoscopy. ?  ?Past Medical History:  ?Diagnosis Date  ? Allergy 2003  ? allergic rhinitis  ? Anxiety   ? Barrett's esophagus   ? Blood clot in vein   ? in thumb, had part of thumb amputated, no follow up  ? GERD (gastroesophageal reflux disease)   ? Observation or evaluation for suspected condition 04/28/2007  ? heme Evaluation no known pathology (Dr.Metjian, [facility])  ? Raynaud's disease   ? Skin necrosis ([place])   ? left thumb- vascular eval at [facility]  ? ? ?Past Surgical History:  ?Procedure Laterality Date  ? ABDOMINAL HYSTERECTOMY    ? CHOLECYSTECTOMY  1995  ? conization, BTL  ? COLONOSCOPY WITH PROPOFOL N/A 08/13/2017  ? Procedure: COLONOSCOPY WITH PROPOFOL;  Surgeon: Lucilla Lame, MD;  Location: [place];  Service: Endoscopy;  Laterality: N/A;  ? CYSTECTOMY  1990  ? cyst in fallopian tube removed, Tube intact  ? ESOPHAGOGASTRODUODENOSCOPY (EGD) WITH PROPOFOL N/A 08/13/2017  ? Procedure: ESOPHAGOGASTRODUODENOSCOPY (EGD) WITH PROPOFOL;  Surgeon: Lucilla Lame, MD;  Location: [facility];  Service: Endoscopy;  Laterality: N/A;  ? GROIN DISSECTION  05/2000  ? Right groin Lymph node dissection for cat scratch Disease  ? MRI  07/2004  ? negative except sinusitis  ? POLYPECTOMY N/A 08/13/2017  ? Procedure: POLYPECTOMY;  Surgeon: Lucilla Lame, MD;  Location: [place];  Service: Endoscopy;  Laterality: N/A;  ? Thoracic aortogram  09/23/2006  ? Thoracic aortogram with select LUE arteriogram small radial artery below the brach bifurcation  ? TUBAL LIGATION  1995  ? conization, BTL; choleycystectomy  ? VAGINAL DELIVERY    ? X's 2  ? ? ?Prior to Admission medications   ?Medication Sig  Start Date End Date Taking? Authorizing Provider  ?Multiple Vitamin (MULTIVITAMIN) tablet Take 1 tablet by mouth daily.   Yes [provider]  ?Omega-3 Fatty Acids (FISH OIL) 1000 MG CAPS Take 1 capsule (1,000 mg total) by mouth 2 (two) times daily. 12/31/20  Yes Tonia Ghent, MD  ?pantoprazole (PROTONIX) 40 MG tablet TAKE 1 TABLET BY MOUTH EVERY DAY 11/21/20  Yes Tonia Ghent, MD  ?sertraline (ZOLOFT) 100 MG tablet Take 1.5 tablets (150 mg total) by mouth daily. 11/23/20  Yes Tonia Ghent, MD  ?amoxicillin-clavulanate (AUGMENTIN) 875-125 MG tablet Take 1 tablet by mouth 2 (two) times daily. ?Patient not taking: Reported on 05/16/2021 04/22/21   Michela Pitcher, NP  ? ? ?Allergies as of 05/16/2021 - Review Complete 05/16/2021  ?Allergen Reaction Noted  ? Chantix [varenicline tartrate] Other (See Comments) 01/17/2019  ? ? ?Family History  ?Problem Relation Age of Onset  ? Cancer Mother 52  ?     breast cancer/lymphoma disease 10/2004  ? Breast cancer Mother   ? Hypertension Father   ? Stroke Father   ? Cancer Father   ? Alcohol abuse Brother   ? Cancer Maternal Grandmother   ?     bone cancer //young in 30's  ? Alcohol abuse Maternal Grandmother   ? Stroke Maternal Grandmother   ? Colon cancer Maternal Grandfather   ? Cancer Maternal Grandfather   ?     ? bone cancer./ prostate/colon cancer  ? ? ?  Social History  ? ?Socioeconomic History  ? Marital status: Unknown  ?  Spouse name: Not on file  ? Number of children: Not on file  ? Years of education: Not on file  ? Highest education level: Not on file  ?Occupational History  ? Not on file  ?Tobacco Use  ? Smoking status: Every Day  ?  Types: E-cigarettes  ? Smokeless tobacco: Never  ? Tobacco comments:  ?  Former smoker as of 2022.  30+ pack years.    ?Vaping Use  ? Vaping Use: Never used  ?Substance and Sexual Activity  ? Alcohol use: Yes  ?  Alcohol/week: 1.0 - 2.0 standard drink  ?  Types: 1 - 2 Glasses of wine per week  ?  Comment: wine at night  ? Drug  use: No  ? Sexual activity: Not Currently  ?Other Topics Concern  ? Not on file  ?Social History Narrative  ? Lives alone.    ? Working at [employer] in [place].    ? Divorced  ? ?Social Determinants of Health  ? ?Financial Resource Strain: Not on file  ?Food Insecurity: Not on file  ?Transportation Needs: Not on file  ?Physical Activity: Not on file  ?Stress: Not on file  ?Social Connections: Not on file  ?Intimate Partner Violence: Not on file  ? ? ?Review of Systems: ?See HPI, otherwise negative ROS ? ?Physical Exam: ?BP (!) 144/98   Pulse 99   Temp (!) 97.5 ?F (36.4 ?C) (Temporal)   Ht '5\' 4"'$  (1.626 m)   Wt 56.8 kg   SpO2 98%   BMI 21.49 kg/m?  ?General:   Alert,  pleasant and cooperative in NAD ?Head:  Normocephalic and atraumatic. ?Neck:  Supple; no masses or thyromegaly. ?Lungs:  Clear throughout to auscultation.    ?Heart:  Regular rate and rhythm. ?Abdomen:  Soft, nontender and nondistended. Normal bowel sounds, without guarding, and without rebound.   ?Neurologic:  Alert and  oriented x4;  grossly normal neurologically. ? ?Impression/Plan: ?DAVANEE KLINKNER is here for an endoscopy and colonoscopy to be performed for Barrett's and a history of adenomatous polyps on 2019 ? ? ?Risks, benefits, limitations, and alternatives regarding  endoscopy and colonoscopy have been reviewed with the patient.  Questions have been answered.  All parties agreeable. ? ? ?Lucilla Lame, MD  05/20/2021, 11:05 AM ?

## 2021-05-20 NOTE — Anesthesia Preprocedure Evaluation (Signed)
Anesthesia Evaluation  ?Patient identified by MRN, date of birth, ID band ?Patient awake ? ? ? ?Reviewed: ?Allergy & Precautions, H&P , NPO status , Patient's Chart, lab work & pertinent test results ? ?Airway ?Mallampati: II ? ?TM Distance: >3 FB ?Neck ROM: full ? ? ? Dental ?no notable dental hx. ? ?  ?Pulmonary ?Current SmokerPatient did not abstain from smoking.,  ?  ?Pulmonary exam normal ?breath sounds clear to auscultation ? ? ? ? ? ? Cardiovascular ?Normal cardiovascular exam ?Rhythm:regular Rate:Normal ? ?Raynaud ?  ?Neuro/Psych ?PSYCHIATRIC DISORDERS   ? GI/Hepatic ?GERD  ,  ?Endo/Other  ? ? Renal/GU ?  ? ?  ?Musculoskeletal ? ? Abdominal ?  ?Peds ? Hematology ?  ?Anesthesia Other Findings ? ? Reproductive/Obstetrics ? ?  ? ? ? ? ? ? ? ? ? ? ? ? ? ?  ?  ? ? ? ? ? ? ? ? ?Anesthesia Physical ?Anesthesia Plan ? ?ASA: 2 ? ?Anesthesia Plan: General  ? ?Post-op Pain Management: Minimal or no pain anticipated  ? ?Induction: Intravenous ? ?PONV Risk Score and Plan: 3 and Treatment may vary due to age or medical condition, Propofol infusion and TIVA ? ?Airway Management Planned: Natural Airway ? ?Additional Equipment:  ? ?Intra-op Plan:  ? ?Post-operative Plan:  ? ?Informed Consent: I have reviewed the patients History and Physical, chart, labs and discussed the procedure including the risks, benefits and alternatives for the proposed anesthesia with the patient or authorized representative who has indicated his/her understanding and acceptance.  ? ? ? ?Dental Advisory Given ? ?Plan Discussed with: CRNA ? ?Anesthesia Plan Comments:   ? ? ? ? ? ? ?Anesthesia Quick Evaluation ? ?

## 2021-05-20 NOTE — Transfer of Care (Signed)
Immediate Anesthesia Transfer of Care Note ? ?Patient: Krystal Mercado ? ?Procedure(s) Performed: COLONOSCOPYn WITH BIOPSY (Rectum) ?POLYPECTOMY (Rectum) ?ESOPHAGOGASTRODUODENOSCOPY (EGD) WITH BIOPSY (Mouth) ? ?Patient Location: PACU ? ?Anesthesia Type: General ? ?Level of Consciousness: awake, alert  and patient cooperative ? ?Airway and Oxygen Therapy: Patient Spontanous Breathing and Patient connected to supplemental oxygen ? ?Post-op Assessment: Post-op Vital signs reviewed, Patient's Cardiovascular Status Stable, Respiratory Function Stable, Patent Airway and No signs of Nausea or vomiting ? ?Post-op Vital Signs: Reviewed and stable ? ?Complications: No notable events documented. ? ?

## 2021-05-20 NOTE — Op Note (Signed)
[facility] ?Gastroenterology ?Patient Name: Krystal Mercado ?Procedure Date: 05/20/2021 11:43 AM ?MRN: [id number] ?Account #: [id number] ?Date of Birth: [birth year] ?Admit Type: Outpatient ?Age: 51 ?Room: [facility] OR ROOM 01 ?Gender: Female ?Note Status: Finalized ?Instrument Name: [id number] ?Procedure:             Upper GI endoscopy ?Indications:           Follow-up of Barrett's esophagus ?Providers:             Lucilla Lame MD, MD ?Referring MD:          Elveria Rising. Damita Dunnings, MD (Referring MD) ?Medicines:             Propofol per Anesthesia ?Complications:         No immediate complications. ?Procedure:             Pre-Anesthesia Assessment: ?                       - Prior to the procedure, a History and Physical was  ?                       performed, and patient medications and allergies were  ?                       reviewed. The patient's tolerance of previous  ?                       anesthesia was also reviewed. The risks and benefits  ?                       of the procedure and the sedation options and risks  ?                       were discussed with the patient. All questions were  ?                       answered, and informed consent was obtained. Prior  ?                       Anticoagulants: The patient has taken no previous  ?                       anticoagulant or antiplatelet agents. ASA Grade  ?                       Assessment: II - A patient with mild systemic disease.  ?                       After reviewing the risks and benefits, the patient  ?                       was deemed in satisfactory condition to undergo the  ?                       procedure. ?                       After obtaining informed consent, the endoscope was  ?  passed under direct vision. Throughout the procedure,  ?                       the patient's blood pressure, pulse, and oxygen  ?                       saturations were monitored continuously. The Endoscope  ?                       was  introduced through the mouth, and advanced to the  ?                       second part of duodenum. The upper GI endoscopy was  ?                       accomplished without difficulty. The patient tolerated  ?                       the procedure well. ?Findings: ?     There were esophageal mucosal changes classified as Barrett's stage  ?     C5-M6 per Prague criteria present in the lower third of the esophagus.  ?     The maximum longitudinal extent of these mucosal changes was 5 cm in  ?     length. Mucosa was biopsied with a cold forceps for histology at  ?     intervals of 1 cm from 30 to 35 cm from the incisors. ?     The stomach was normal. ?     The examined duodenum was normal. ?Impression:            - Esophageal mucosal changes classified as Barrett's  ?                       stage C5-M6 per Prague criteria. Biopsied. ?                       - Normal stomach. ?                       - Normal examined duodenum. ?Recommendation:        - Discharge patient to home. ?                       - Resume previous diet. ?                       - Continue present medications. ?                       - Await pathology results. ?                       - Perform a colonoscopy today. ?Procedure Code(s):     --- Professional --- ?                       [id number], Esophagogastroduodenoscopy, flexible,  ?                       transoral; with biopsy, single or multiple ?Diagnosis Code(s):     --- Professional --- ?  K22.70, Barrett's esophagus without dysplasia ?CPT copyright 2019 American Medical Association. All rights reserved. ?The codes documented in this report are preliminary and upon coder review may  ?be revised to meet current compliance requirements. ?Lucilla Lame MD, MD ?05/20/2021 12:03:41 PM ?This report has been signed electronically. ?Number of Addenda: 0 ?Note Initiated On: 05/20/2021 11:43 AM ?Total Procedure Duration: 0 hours 8 minutes 55 seconds  ?Estimated Blood Loss:  Estimated blood loss:  none. ?     [facility] ?

## 2021-05-21 ENCOUNTER — Encounter: Payer: Self-pay | Admitting: Gastroenterology

## 2021-05-22 ENCOUNTER — Encounter: Payer: Self-pay | Admitting: Gastroenterology

## 2021-05-22 LAB — SURGICAL PATHOLOGY

## 2021-05-30 ENCOUNTER — Ambulatory Visit: Payer: BC Managed Care – PPO | Admitting: Gastroenterology

## 2021-06-04 ENCOUNTER — Other Ambulatory Visit: Payer: Self-pay | Admitting: Family Medicine

## 2021-08-23 ENCOUNTER — Encounter: Payer: Self-pay | Admitting: Emergency Medicine

## 2021-08-23 ENCOUNTER — Ambulatory Visit
Admission: EM | Admit: 2021-08-23 | Discharge: 2021-08-23 | Disposition: A | Discharge status: Home / Self Care | Payer: BC Managed Care – PPO | Attending: Emergency Medicine | Admitting: Emergency Medicine

## 2021-08-23 DIAGNOSIS — R000 Tachycardia, unspecified: Secondary | ICD-10-CM

## 2021-08-23 NOTE — Discharge Instructions (Addendum)
I provided some information about sinus tachycardia which I hope that you will find useful.  Please consider making a follow-up appointment with your primary care provider to evaluate you for signs of anemia and abnormal thyroid function as these are the most common noncardiac causes of elevated heart rate.  At this time, I recommend that you continue to stay well-hydrated, work on decreasing much you are smoking, making sure you are getting plenty of rest at night, eating a well-balanced diet and work on active implementation of coping strategies for daily fluctuations in often unavoidable stress.  Thank you for visiting urgent care today.  The opportunity to participate in her care.

## 2021-08-23 NOTE — ED Provider Notes (Signed)
[facility]    CSN: [id number] Arrival date & time: 08/23/21  1141    HISTORY   Chief Complaint  Patient presents with   Tachycardia   HPI Krystal Mercado is a 51 y.o. female. Pt here with tachycardia in the 125-135 range based on her apple watch along with feeling tired and shaky since 3 days ago, states it resolved that same day but started back again this morning.  Pt endorses anxiety hx, but takes meds for it.  Denies history of cardiac abnormalities. Patient reports daily use of cigarettes despite prior report of quitting in 2022 states she smokes cigarettes and drinks coffee every morning which is not new.  Patient states she also continues to take Zoloft which she has taken for years for anxiety.  Patient states she has never had tachycardia in the past.  Patient denies no new stressors.  Patient states the first day this happened, she laid down to take a nap which improved her heart rate but not quite to normal and adds that throughout the rest of the day her heart rate slowly decreased back to normal rate.  The history is provided by the patient.   Past Medical History:  Diagnosis Date   Allergy 2003   allergic rhinitis   Anxiety    Barrett's esophagus    Blood clot in vein    in thumb, had part of thumb amputated, no follow up   GERD (gastroesophageal reflux disease)    Observation or evaluation for suspected condition 04/28/2007   heme Evaluation no known pathology (Dr.Metjian, [facility])   Raynaud's disease    Skin necrosis (HCC)    left thumb- vascular eval at [facility]   Patient Active Problem List   Diagnosis Date Noted   Personal history of colonic polyps    Polyp of sigmoid colon    Acute non-recurrent sinusitis 04/22/2021   Bursitis 12/26/2020   Routine general medical examination at a health care facility 11/13/2019   Advance care planning 11/13/2019   Hyperlipidemia 11/13/2019   Complex cyst of right ovary 10/09/2017   Benign neoplasm of  transverse colon    Barrett's esophagus    SUI (stress urinary incontinence, female) 05/31/2017   Ocular migraine 05/31/2017   Radicular pain in left arm 05/31/2017   SKIN LESIONS, MULTIPLE 08/31/2007   RAYNAUDS SYNDROME 07/26/2007   VERTIGO 03/25/2007   Anxiety state 11/20/2006   TOBACCO ABUSE 09/22/2006   ALLERGIC RHINITIS 09/21/2006   ARTHRITIS 09/21/2006   HX, PERSONAL, TOBACCO USE 09/21/2006   HEADACHE, CHRONIC, HX OF 09/21/2006   Past Surgical History:  Procedure Laterality Date   ABDOMINAL HYSTERECTOMY     CHOLECYSTECTOMY  1995   conization, BTL   COLONOSCOPY N/A 05/20/2021   Procedure: COLONOSCOPYn WITH BIOPSY;  Surgeon: Lucilla Lame, MD;  Location: [facility];  Service: Endoscopy;  Laterality: N/A;   COLONOSCOPY WITH PROPOFOL N/A 08/13/2017   Procedure: COLONOSCOPY WITH PROPOFOL;  Surgeon: Lucilla Lame, MD;  Location: [place];  Service: Endoscopy;  Laterality: N/A;   CYSTECTOMY  1990   cyst in fallopian tube removed, Tube intact   ESOPHAGOGASTRODUODENOSCOPY N/A 05/20/2021   Procedure: ESOPHAGOGASTRODUODENOSCOPY (EGD) WITH BIOPSY;  Surgeon: Lucilla Lame, MD;  Location: [place];  Service: Endoscopy;  Laterality: N/A;   ESOPHAGOGASTRODUODENOSCOPY (EGD) WITH PROPOFOL N/A 08/13/2017   Procedure: ESOPHAGOGASTRODUODENOSCOPY (EGD) WITH PROPOFOL;  Surgeon: Lucilla Lame, MD;  Location: [place];  Service: Endoscopy;  Laterality: N/A;   GROIN DISSECTION  05/2000  Right groin Lymph node dissection for cat scratch Disease   MRI  07/2004   negative except sinusitis   POLYPECTOMY N/A 08/13/2017   Procedure: POLYPECTOMY;  Surgeon: Lucilla Lame, MD;  Location: [place];  Service: Endoscopy;  Laterality: N/A;   POLYPECTOMY N/A 05/20/2021   Procedure: POLYPECTOMY;  Surgeon: Lucilla Lame, MD;  Location: [place];  Service: Endoscopy;  Laterality: N/A;   Thoracic aortogram  09/23/2006   Thoracic aortogram with select LUE arteriogram  small radial artery below the brach bifurcation   TUBAL LIGATION  1995   conization, BTL; choleycystectomy   VAGINAL DELIVERY     X's 2   OB History     Gravida  2   Para  2   Term  2   Preterm      AB      Living  2      SAB      IAB      Ectopic      Multiple      Live Births             Home Medications    Prior to Admission medications   Medication Sig Start Date End Date Taking? Authorizing Provider  Multiple Vitamin (MULTIVITAMIN) tablet Take 1 tablet by mouth daily.    [provider]  Omega-3 Fatty Acids (FISH OIL) 1000 MG CAPS Take 1 capsule (1,000 mg total) by mouth 2 (two) times daily. 12/31/20   Tonia Ghent, MD  pantoprazole (PROTONIX) 40 MG tablet TAKE 1 TABLET BY MOUTH EVERY DAY 06/04/21   Tonia Ghent, MD  sertraline (ZOLOFT) 100 MG tablet TAKE 1.5 TABLETS ('150MG'$  TOTAL) BY MOUTH DAILY 06/04/21   Tonia Ghent, MD    Family History Family History  Problem Relation Age of Onset   Cancer Mother 34       breast cancer/lymphoma disease 10/2004   Breast cancer Mother    Hypertension Father    Stroke Father    Cancer Father    Alcohol abuse Brother    Cancer Maternal Grandmother        bone cancer //young in 30's   Alcohol abuse Maternal Grandmother    Stroke Maternal Grandmother    Colon cancer Maternal Grandfather    Cancer Maternal Grandfather        ? bone cancer./ prostate/colon cancer   Social History Social History   Tobacco Use   Smoking status: Every Day    Types: E-cigarettes   Smokeless tobacco: Never   Tobacco comments:    Former smoker as of 2022.  30+ pack years.    Vaping Use   Vaping Use: Never used  Substance Use Topics   Alcohol use: Yes    Alcohol/week: 1.0 - 2.0 standard drink of alcohol    Types: 1 - 2 Glasses of wine per week    Comment: wine at night   Drug use: No   Allergies   Chantix [varenicline tartrate]  Review of Systems Review of Systems Pertinent findings noted in history  of present illness.   Physical Exam Triage Vital Signs ED Triage Vitals  Enc Vitals Group     BP 01/11/21 0827 (!) 147/82     Pulse Rate 01/11/21 0827 72     Resp 01/11/21 0827 18     Temp 01/11/21 0827 98.3 F (36.8 C)     Temp Source 01/11/21 0827 Oral     SpO2 01/11/21 0827 98 %  Weight --      Height --      Head Circumference --      Peak Flow --      Pain Score 01/11/21 0826 5     Pain Loc --      Pain Edu? --      Excl. in [place]? --   No data found.  Updated Vital Signs BP (!) 146/96   Pulse (!) 110   Temp 98.7 F (37.1 C)   Resp (!) 24   SpO2 98%   Physical Exam Vitals and nursing note reviewed.  Constitutional:      General: She is not in acute distress.    Appearance: Normal appearance. She is not ill-appearing.  HENT:     Head: Normocephalic and atraumatic.     Salivary Glands: Right salivary gland is not diffusely enlarged or tender. Left salivary gland is not diffusely enlarged or tender.     Right Ear: Tympanic membrane, ear canal and external ear normal. No drainage. No middle ear effusion. There is no impacted cerumen. Tympanic membrane is not erythematous or bulging.     Left Ear: Tympanic membrane, ear canal and external ear normal. No drainage.  No middle ear effusion. There is no impacted cerumen. Tympanic membrane is not erythematous or bulging.     Nose: Nose normal. No nasal deformity, septal deviation, mucosal edema, congestion or rhinorrhea.     Right Turbinates: Not enlarged, swollen or pale.     Left Turbinates: Not enlarged, swollen or pale.     Right Sinus: No maxillary sinus tenderness or frontal sinus tenderness.     Left Sinus: No maxillary sinus tenderness or frontal sinus tenderness.     Mouth/Throat:     Lips: Pink. No lesions.     Mouth: Mucous membranes are moist. No oral lesions.     Pharynx: Oropharynx is clear. Uvula midline. No posterior oropharyngeal erythema or uvula swelling.     Tonsils: No tonsillar exudate. 0 on the  right. 0 on the left.  Eyes:     General: Lids are normal.        Right eye: No discharge.        Left eye: No discharge.     Extraocular Movements: Extraocular movements intact.     Conjunctiva/sclera: Conjunctivae normal.     Right eye: Right conjunctiva is not injected.     Left eye: Left conjunctiva is not injected.  Neck:     Trachea: Trachea and phonation normal.  Cardiovascular:     Rate and Rhythm: Regular rhythm. Tachycardia present.     Pulses: Normal pulses.     Heart sounds: Normal heart sounds. No murmur heard.    No friction rub. No gallop.  Pulmonary:     Effort: Pulmonary effort is normal. No accessory muscle usage, prolonged expiration or respiratory distress.     Breath sounds: Normal breath sounds. No stridor, decreased air movement or transmitted upper airway sounds. No decreased breath sounds, wheezing, rhonchi or rales.  Chest:     Chest wall: No tenderness.  Musculoskeletal:        General: Normal range of motion.     Cervical back: Normal range of motion and neck supple. Normal range of motion.  Lymphadenopathy:     Cervical: No cervical adenopathy.  Skin:    General: Skin is warm and dry.     Findings: No erythema or rash.  Neurological:     General: No focal deficit present.  Mental Status: She is alert and oriented to person, place, and time.  Psychiatric:        Mood and Affect: Mood normal.        Behavior: Behavior normal.     Visual Acuity Right Eye Distance:   Left Eye Distance:   Bilateral Distance:    Right Eye Near:   Left Eye Near:    Bilateral Near:     UC Couse / Diagnostics / Procedures:    EKG  Radiology No results found.  Procedures Procedures (including critical care time)  UC Diagnoses / Final Clinical Impressions(s)   I have reviewed the triage vital signs and the nursing notes.  Pertinent labs & imaging results that were available during my care of the patient were reviewed by me and considered in my medical  decision making (see chart for details).    Final diagnoses:  Tachycardia   Patient reassured that I do not believe the cause of her tachycardia is cardiac in origin.  Patient advised to follow-up with primary care to be evaluated for thyroid dysfunction and possible anemia.  Return precautions advised.  ED Prescriptions   None    PDMP not reviewed this encounter.  Pending results:  Labs Reviewed - No data to display  Medications Ordered in UC: Medications - No data to display  Disposition Upon Discharge:  Condition: stable for discharge home Home: take medications as prescribed; routine discharge instructions as discussed; follow up as advised.  Patient presented with an acute illness with associated systemic symptoms and significant discomfort requiring urgent management. In my opinion, this is a condition that a prudent lay person (someone who possesses an average knowledge of health and medicine) may potentially expect to result in complications if not addressed urgently such as respiratory distress, impairment of bodily function or dysfunction of bodily organs.   Routine symptom specific, illness specific and/or disease specific instructions were discussed with the patient and/or caregiver at length.   As such, the patient has been evaluated and assessed, work-up was performed and treatment was provided in alignment with urgent care protocols and evidence based medicine.  Patient/parent/caregiver has been advised that the patient may require follow up for further testing and treatment if the symptoms continue in spite of treatment, as clinically indicated and appropriate.  Patient/parent/caregiver has been advised to return to the [facility] or PCP if no better; to PCP or the Emergency Department if new signs and symptoms develop, or if the current signs or symptoms continue to change or worsen for further workup, evaluation and treatment as clinically indicated and appropriate  The  patient will follow up with their current PCP if and as advised. If the patient does not currently have a PCP we will assist them in obtaining one.   The patient may need specialty follow up if the symptoms continue, in spite of conservative treatment and management, for further workup, evaluation, consultation and treatment as clinically indicated and appropriate.   Patient/parent/caregiver verbalized understanding and agreement of plan as discussed.  All questions were addressed during visit.  Please see discharge instructions below for further details of plan.  Discharge Instructions:   Discharge Instructions      I provided some information about sinus tachycardia which I hope that you will find useful.  Please consider making a follow-up appointment with your primary care provider to evaluate you for signs of anemia and abnormal thyroid function as these are the most common noncardiac causes of elevated heart rate.  At  this time, I recommend that you continue to stay well-hydrated, work on decreasing much you are smoking, making sure you are getting plenty of rest at night, eating a well-balanced diet and work on active implementation of coping strategies for daily fluctuations in often unavoidable stress.  Thank you for visiting urgent care today.  The opportunity to participate in her care.      This office note has been dictated using Museum/gallery curator.  Unfortunately, and despite my best efforts, this method of dictation can sometimes lead to occasional typographical or grammatical errors.  I apologize in advance if this occurs.     Lynden Oxford Scales, PA-C 08/23/21 1226

## 2021-08-23 NOTE — ED Triage Notes (Signed)
Pt here with tachycardia in the 125-135 range based on her apple watch along with feeling tired and shaky since Wednesday. States it started and stopped on Wed, but it started back up this morning. Pt does have anxiety hx, but takes meds for it. No cardiac hx.

## 2021-08-30 ENCOUNTER — Ambulatory Visit (INDEPENDENT_AMBULATORY_CARE_PROVIDER_SITE_OTHER): Payer: BC Managed Care – PPO

## 2021-08-30 ENCOUNTER — Ambulatory Visit: Payer: BC Managed Care – PPO | Admitting: Family Medicine

## 2021-08-30 ENCOUNTER — Encounter: Payer: Self-pay | Admitting: Family Medicine

## 2021-08-30 VITALS — BP 138/84 | HR 92 | Temp 98.0°F | Ht 64.0 in | Wt 122.0 lb

## 2021-08-30 DIAGNOSIS — R000 Tachycardia, unspecified: Secondary | ICD-10-CM

## 2021-08-30 LAB — COMPREHENSIVE METABOLIC PANEL
ALT: 17 U/L (ref 0–35)
AST: 21 U/L (ref 0–37)
Albumin: 4.3 g/dL (ref 3.5–5.2)
Alkaline Phosphatase: 89 U/L (ref 39–117)
BUN: 9 mg/dL (ref 6–23)
CO2: 28 mEq/L (ref 19–32)
Calcium: 9.9 mg/dL (ref 8.4–10.5)
Chloride: 100 mEq/L (ref 96–112)
Creatinine, Ser: 0.87 mg/dL (ref 0.40–1.20)
GFR: 76.97 mL/min (ref >60.00)
Glucose, Bld: 93 mg/dL (ref 70–99)
Potassium: 4.8 mEq/L (ref 3.5–5.1)
Sodium: 137 mEq/L (ref 135–145)
Total Bilirubin: 0.4 mg/dL (ref 0.2–1.2)
Total Protein: 7.4 g/dL (ref 6.0–8.3)

## 2021-08-30 LAB — CBC WITH DIFFERENTIAL/PLATELET
Basophils Absolute: 0.1 10*3/uL (ref 0.0–0.1)
Basophils Relative: 1.2 % (ref 0.0–3.0)
Eosinophils Absolute: 0.3 10*3/uL (ref 0.0–0.7)
Eosinophils Relative: 2.6 % (ref 0.0–5.0)
HCT: 43.9 % (ref 36.0–46.0)
Hemoglobin: 14.5 g/dL (ref 12.0–15.0)
Lymphocytes Relative: 28.5 % (ref 12.0–46.0)
Lymphs Abs: 2.9 10*3/uL (ref 0.7–4.0)
MCHC: 33 g/dL (ref 30.0–36.0)
MCV: 102.5 fl — ABNORMAL HIGH (ref 78.0–100.0)
Monocytes Absolute: 0.5 10*3/uL (ref 0.1–1.0)
Monocytes Relative: 4.7 % (ref 3.0–12.0)
Neutro Abs: 6.5 10*3/uL (ref 1.4–7.7)
Neutrophils Relative %: 63 % (ref 43.0–77.0)
Platelets: 257 10*3/uL (ref 150.0–400.0)
RBC: 4.28 Mil/uL (ref 3.87–5.11)
RDW: 14.4 % (ref 11.5–15.5)
WBC: 10.3 10*3/uL (ref 4.0–10.5)

## 2021-08-30 LAB — TSH: TSH: 1.19 u[IU]/mL (ref 0.35–5.50)

## 2021-08-30 MED ORDER — PANTOPRAZOLE SODIUM 40 MG PO TBEC: 40.0000 mg | DELAYED_RELEASE_TABLET | Freq: Every day | ORAL | 3 refills | Status: DC

## 2021-08-30 MED ORDER — SERTRALINE HCL 100 MG PO TABS: ORAL_TABLET | ORAL | 3 refills | Status: DC

## 2021-08-30 MED ORDER — METOPROLOL TARTRATE 25 MG PO TABS: 12.5000 mg | ORAL_TABLET | Freq: Two times a day (BID) | ORAL | 1 refills | Status: DC | PRN

## 2021-08-30 NOTE — Patient Instructions (Signed)
Go to the lab on the way out.   If you have mychart we'll likely use that to update you.    We'll check on the heart monitor.   Use metoprolol if needed.   Take care.  Glad to see you. If chest pain or if unrelenting fast heart rate, then to go the ER or dial 911.

## 2021-08-30 NOTE — Progress Notes (Unsigned)
Pulse was 125-135 on apple watch.  Had been going on for about 3 days, episodically.  Would feel shaky and lightheaded when going on.  Random onset.  Quick onset,  would last for variable time, could last a few hours.  Still on SSRI at baseline.  Since UC eval she has had daily sx.  No syncope.  No CP.  No h/o MI.  Recent EKG d/w pt.    1 cup of coffee daily.  No cocaine use.    Her parents passed, she was caring for them.  Safe at home now, living in [place].    Meds, vitals, and allergies reviewed.   ROS: Per HPI unless specifically indicated in ROS section   GEN: nad, alert and oriented HEENT: mucous membranes moist NECK: supple w/o LA CV: rrr. PULM: ctab, no inc wob ABD: soft, +bs EXT: no edema SKIN: no acute rash  Recent EKG reviewed with patient, done on 08/23/2021.  Scanned.  Sinus tachycardia but otherwise unremarkable.

## 2021-08-30 NOTE — Progress Notes (Unsigned)
Enrolled patient for a 14 day Zio XT  monitor to be mailed to patients home  °

## 2021-09-01 DIAGNOSIS — R000 Tachycardia, unspecified: Secondary | ICD-10-CM | POA: Insufficient documentation

## 2021-09-01 NOTE — Assessment & Plan Note (Signed)
Her pulse is reasonable at office visit.  She is having episodic symptoms.  Discussed as needed metoprolol use with routine cautions. We'll check setting up ambulatory heart monitor.   If chest pain or if unrelenting fast heart rate, then to go the ER or dial 911.  She agrees. See notes on labs.

## 2021-09-03 DIAGNOSIS — R000 Tachycardia, unspecified: Secondary | ICD-10-CM

## 2022-01-19 ENCOUNTER — Emergency Department (HOSPITAL_BASED_OUTPATIENT_CLINIC_OR_DEPARTMENT_OTHER)
Admission: EM | Admit: 2022-01-19 | Discharge: 2022-01-19 | Discharge status: Against Medical Advice | Payer: BC Managed Care – PPO | Attending: Emergency Medicine | Admitting: Emergency Medicine

## 2022-01-19 ENCOUNTER — Encounter (HOSPITAL_BASED_OUTPATIENT_CLINIC_OR_DEPARTMENT_OTHER): Payer: Self-pay | Admitting: Emergency Medicine

## 2022-01-19 DIAGNOSIS — R519 Headache, unspecified: Secondary | ICD-10-CM | POA: Diagnosis not present

## 2022-01-19 DIAGNOSIS — Z5321 Procedure and treatment not carried out due to patient leaving prior to being seen by health care provider: Secondary | ICD-10-CM | POA: Diagnosis not present

## 2022-01-19 DIAGNOSIS — M2550 Pain in unspecified joint: Secondary | ICD-10-CM | POA: Insufficient documentation

## 2022-01-19 NOTE — ED Triage Notes (Signed)
Sinus pain and pressure for months. Recently moved into a new apartment. Concerned about mold. Also reports joint pain.

## 2022-01-20 ENCOUNTER — Telehealth: Payer: Self-pay | Admitting: Family Medicine

## 2022-01-20 NOTE — Telephone Encounter (Signed)
Pt called stating she was exposed to mold in her apartment & has been having sinus issues & itchy eyes. Pt asked if there was a test to see if her symptoms was from the mold exposure? Suggested transferring pt to access nurse & possibly setting ov with Damita Dunnings. Pt stated she'd just call back. Call back # [phone]

## 2022-01-20 NOTE — Telephone Encounter (Signed)
Called and spoke with patient. She's states she found mold in her apartment and has been experiencing HA's, sinus issues and itchy eyes. She wants to be tested for mold exposure. Appt scheduled for tomorrow at 10 am.

## 2022-01-21 ENCOUNTER — Encounter: Payer: Self-pay | Admitting: Family Medicine

## 2022-01-21 ENCOUNTER — Ambulatory Visit: Payer: BC Managed Care – PPO | Admitting: Family Medicine

## 2022-01-21 VITALS — BP 136/78 | HR 110 | Temp 97.9°F | Ht 65.0 in | Wt 121.0 lb

## 2022-01-21 DIAGNOSIS — J019 Acute sinusitis, unspecified: Secondary | ICD-10-CM

## 2022-01-21 DIAGNOSIS — F17200 Nicotine dependence, unspecified, uncomplicated: Secondary | ICD-10-CM

## 2022-01-21 DIAGNOSIS — M25552 Pain in left hip: Secondary | ICD-10-CM

## 2022-01-21 DIAGNOSIS — Z77120 Contact with and (suspected) exposure to mold (toxic): Secondary | ICD-10-CM

## 2022-01-21 DIAGNOSIS — R000 Tachycardia, unspecified: Secondary | ICD-10-CM | POA: Diagnosis not present

## 2022-01-21 MED ORDER — AMOXICILLIN-POT CLAVULANATE 875-125 MG PO TABS: 1.0000 | ORAL_TABLET | Freq: Two times a day (BID) | ORAL | 0 refills | Status: AC

## 2022-01-21 NOTE — Progress Notes (Unsigned)
Patient ID: Krystal Mercado, female    DOB: Oct 28, 1970, 51 y.o.   MRN: [id number]  This visit was conducted in person.  BP 136/78   Pulse (!) 110   Temp 97.9 F (36.6 C) (Temporal)   Ht '5\' 5"'$  (1.651 m)   Wt 121 lb (54.9 kg)   SpO2 95%   BMI 20.14 kg/m   BP Readings from Last 3 Encounters:  01/21/22 136/78  01/19/22 (!) 148/96  08/30/21 138/84   CC: months of sinus  Subjective:   HPI: Krystal Mercado is a 51 y.o. female presenting on 01/21/2022 for Sinus Problem (C/o cough, sinus congestion, HA and itchy eyes   Sxs started  mos ago. Found mold in apartment.  Requests mold exposure testing. )   Pt of Dr Josefine Class new to me presents with several months of sinus symptoms including head congestion, itchy watery eyes, colored eye discharge, colored nasal drainage, head fogginess. Notes decreased appetite. Dry cough. Predominant head congestion symptoms.   No fevers/chills, ear or tooth pain, ST, abd pain, nausea/vomiting.   Also notes bilateral hip pain.  No h/o allergic rhinitis in the past.  No h/o asthma.  She has a cat. No prior cat allergies.   She's been taking claritin for this as well as nasal spray   5-6 months ago moved to new apartment. Symptoms acutely worsened with the cold weather when she closed her windows and decreased air circulation.  She recently discovered significant mold in her apartment and took pictures.   Tachycardia noted for the past ~6 months - had reassuring holter monitor. Hasn't been taking metoprolol regularly.      Relevant past medical, surgical, family and social history reviewed and updated as indicated. Interim medical history since our last visit reviewed. Allergies and medications reviewed and updated. Outpatient Medications Prior to Visit  Medication Sig Dispense Refill   Multiple Vitamin (MULTIVITAMIN) tablet Take 1 tablet by mouth daily.     Omega-3 Fatty Acids (FISH OIL) 1000 MG CAPS Take 1 capsule (1,000 mg total) by mouth 2  (two) times daily.     pantoprazole (PROTONIX) 40 MG tablet Take 1 tablet (40 mg total) by mouth daily. 90 tablet 3   sertraline (ZOLOFT) 100 MG tablet TAKE 1.5 TABLETS ('150MG'$  TOTAL) BY MOUTH DAILY 135 tablet 3   metoprolol tartrate (LOPRESSOR) 25 MG tablet Take 0.5-1 tablets (12.5-25 mg total) by mouth 2 (two) times daily as needed (for fast heart rate). (Patient not taking: Reported on 01/21/2022) 60 tablet 1   No facility-administered medications prior to visit.     Per HPI unless specifically indicated in ROS section below Review of Systems  Objective:  BP 136/78   Pulse (!) 110   Temp 97.9 F (36.6 C) (Temporal)   Ht '5\' 5"'$  (1.651 m)   Wt 121 lb (54.9 kg)   SpO2 95%   BMI 20.14 kg/m   Wt Readings from Last 3 Encounters:  01/21/22 121 lb (54.9 kg)  01/19/22 120 lb (54.4 kg)  08/30/21 122 lb (55.3 kg)      Physical Exam Vitals and nursing note reviewed.  Constitutional:      Appearance: Normal appearance. She is not ill-appearing.  HENT:     Head: Normocephalic and atraumatic.     Right Ear: Tympanic membrane, ear canal and external ear normal. There is no impacted cerumen.     Left Ear: Tympanic membrane, ear canal and external ear normal. There is no impacted cerumen.  Nose: Nasal tenderness, mucosal edema and congestion present. No rhinorrhea.     Right Turbinates: Swollen. Not enlarged or pale.     Left Turbinates: Swollen. Not enlarged or pale.     Right Sinus: Frontal sinus tenderness present. No maxillary sinus tenderness.     Left Sinus: Frontal sinus tenderness present. No maxillary sinus tenderness.     Comments: Marked erythema edema and discharge present to left nasal passage    Mouth/Throat:     Mouth: Mucous membranes are moist.     Pharynx: Oropharynx is clear. No oropharyngeal exudate or posterior oropharyngeal erythema.  Eyes:     Extraocular Movements: Extraocular movements intact.     Conjunctiva/sclera: Conjunctivae normal.     Pupils: Pupils are  equal, round, and reactive to light.  Cardiovascular:     Rate and Rhythm: Normal rate and regular rhythm.     Pulses: Normal pulses.     Heart sounds: Normal heart sounds. No murmur heard. Pulmonary:     Effort: Pulmonary effort is normal. No respiratory distress.     Breath sounds: Normal breath sounds. No wheezing, rhonchi or rales.  Musculoskeletal:     Comments:  Neg seated SLR bilaterally. No significant pain with int/ext rotation at hip. No pain at SIJ, or sciatic notch bilaterally.  ++ pain at right GTB to palpation  Lymphadenopathy:     Head:     Right side of head: No submental, submandibular, tonsillar, preauricular or posterior auricular adenopathy.     Left side of head: No submental, submandibular, tonsillar, preauricular or posterior auricular adenopathy.     Cervical: No cervical adenopathy.     Right cervical: No superficial cervical adenopathy.    Left cervical: No superficial cervical adenopathy.     Upper Body:     Right upper body: No supraclavicular adenopathy.     Left upper body: No supraclavicular adenopathy.  Skin:    Findings: No rash.  Neurological:     Mental Status: She is alert.  Psychiatric:        Mood and Affect: Mood normal.        Behavior: Behavior normal.        Assessment & Plan:   Problem List Items Addressed This Visit     TOBACCO ABUSE    Encouraged full cessation.       Acute non-recurrent sinusitis - Primary    Months of sinus symptoms correlating to when she moved into her apartment, recently found to have mold.   Exam today suspicious for left sided acute bacterial sinusitis - Rx augmentin course, recommend flonase use, discussed possible ENT vs allergy referral. She wants to wait to see how her meeting with apartment administration goes today and will let us know if she desires to proceed with referral.      Relevant Medications   amoxicillin-clavulanate (AUGMENTIN) 875-125 MG tablet   Tachycardia    H/o this,  previously started on metoprolol however she stopped. Suggested she restart BB.       Contact with or exposure to mold    Concern for exposure to mold in her apartment.  Suggested she get her apartment tested for type of mold. She has appt later today with her apartment complex administration to discuss steps going forward.       Left hip pain    Exam suspicious for trochanteric pain syndrome on left.  Provided with exercises from [facility] pt advisor.         Meds ordered  this encounter  Medications   amoxicillin-clavulanate (AUGMENTIN) 875-125 MG tablet    Sig: Take 1 tablet by mouth 2 (two) times daily for 10 days.    Dispense:  20 tablet    Refill:  0   No orders of the defined types were placed in this encounter.    Patient Instructions  I do think you have sinus infection - may continue nasal saline, claritin, add flonase nasal steroid, and take antibiotic augmentin sent to pharmacy.  Update [place] with how meeting goes today, let me know if you'd like allergist referral for further evaluation/testing.   Follow up plan: Return if symptoms worsen or fail to improve.  Ria Bush, MD

## 2022-01-21 NOTE — Patient Instructions (Addendum)
I do think you have sinus infection - may continue nasal saline, claritin, add flonase nasal steroid, and take antibiotic augmentin sent to pharmacy.  Update [place] with how meeting goes today, let me know if you'd like allergist referral for further evaluation/testing.

## 2022-01-22 ENCOUNTER — Encounter: Payer: Self-pay | Admitting: Family Medicine

## 2022-01-22 DIAGNOSIS — M25552 Pain in left hip: Secondary | ICD-10-CM | POA: Insufficient documentation

## 2022-01-22 DIAGNOSIS — Z77120 Contact with and (suspected) exposure to mold (toxic): Secondary | ICD-10-CM | POA: Insufficient documentation

## 2022-01-22 NOTE — Assessment & Plan Note (Signed)
H/o this, previously started on metoprolol however she stopped. Suggested she restart BB.

## 2022-01-22 NOTE — Assessment & Plan Note (Signed)
Encouraged full cessation.  ?

## 2022-01-22 NOTE — Assessment & Plan Note (Signed)
Exam suspicious for trochanteric pain syndrome on left.  Provided with exercises from [facility] pt advisor.

## 2022-01-22 NOTE — Assessment & Plan Note (Signed)
Concern for exposure to mold in her apartment.  Suggested she get her apartment tested for type of mold. She has appt later today with her apartment complex administration to discuss steps going forward.

## 2022-01-22 NOTE — Assessment & Plan Note (Signed)
Months of sinus symptoms correlating to when she moved into her apartment, recently found to have mold.   Exam today suspicious for left sided acute bacterial sinusitis - Rx augmentin course, recommend flonase use, discussed possible ENT vs allergy referral. She wants to wait to see how her meeting with apartment administration goes today and will let us know if she desires to proceed with referral.

## 2022-05-02 ENCOUNTER — Telehealth: Payer: Self-pay | Admitting: Family Medicine

## 2022-05-02 MED ORDER — METOPROLOL TARTRATE 25 MG PO TABS: 12.5000 mg | ORAL_TABLET | Freq: Two times a day (BID) | ORAL | 1 refills | Status: DC | PRN

## 2022-05-02 NOTE — Telephone Encounter (Signed)
Prescription Request  05/02/2022  Is this a "Controlled Substance" medicine? No  LOV: 08/30/2021  What is the name of the medication or equipment? metoprolol tartrate (LOPRESSOR) 25 MG tablet   Have you contacted your pharmacy to request a refill? No   Which pharmacy would you like this sent to?  [id number]Phone: [phone]: [phone]   Patient notified that their request is being sent to the clinical staff for review and that they should receive a response within 2 business days.   Please advise at Mobile [phone](mobile)  Patient thought that she did not need to take this medication,but she has changed her mind and would like to know if she can have it filled.

## 2022-05-02 NOTE — Telephone Encounter (Signed)
LVM for patient to call and schedule physical.

## 2022-05-02 NOTE — Telephone Encounter (Signed)
Erx has been sent. Patient has not had physical since 2022; please call to schedule.

## 2022-05-26 ENCOUNTER — Encounter: Payer: Self-pay | Admitting: Family Medicine

## 2022-05-26 ENCOUNTER — Ambulatory Visit (INDEPENDENT_AMBULATORY_CARE_PROVIDER_SITE_OTHER)
Admission: RE | Admit: 2022-05-26 | Discharge: 2022-05-26 | Disposition: A | Discharge status: Home / Self Care | Payer: BC Managed Care – PPO | Source: Ambulatory Visit | Attending: Family Medicine | Admitting: Family Medicine

## 2022-05-26 ENCOUNTER — Ambulatory Visit: Payer: BC Managed Care – PPO | Admitting: Family Medicine

## 2022-05-26 VITALS — BP 120/68 | HR 96 | Temp 97.5°F | Ht 65.0 in | Wt 120.0 lb

## 2022-05-26 DIAGNOSIS — R102 Pelvic and perineal pain: Secondary | ICD-10-CM

## 2022-05-26 DIAGNOSIS — M545 Low back pain, unspecified: Secondary | ICD-10-CM

## 2022-05-26 DIAGNOSIS — M25551 Pain in right hip: Secondary | ICD-10-CM | POA: Diagnosis not present

## 2022-05-26 MED ORDER — PREDNISONE 20 MG PO TABS: ORAL_TABLET | ORAL | 0 refills | Status: DC

## 2022-05-26 NOTE — Progress Notes (Unsigned)
Bilateral hip pain since September.  Exercises didn't help.  B symmetric pain.  R>L but same kind of pain.  B posterior buttock pain.  Aches at baseline with sitting.  More pain with standing, leaning forward.  She has pain going up stairs, more than down stairs.  No radicular pain.  No local rash.  No trauma.  Tried taking occ ibuprofen vs tylenol.  Minimal help.  Sitting helps with pain, compared to standing.  More pain on standing.  Pain with initial walking but better the more she walks.    She found out her apartment had mold.   She moved out in the meantime.    SI testing neg B.  Sore in a band across her lower back.   SLR neg B S/S wnl BLE She has opposite lower back pain with hip flexion (ie R hip flexion--->L lower back pain, and the reverse is also true) Trochanteric area not ttp B

## 2022-05-26 NOTE — Patient Instructions (Addendum)
Go to the lab on the way out.   If you have mychart we'll likely use that to update you.    Take 2 prednisone a day for 5 days, then 1 a day for 5 days, with food. Don't take with aleve/ibuprofen.  Take care.  Glad to see you.

## 2022-05-28 ENCOUNTER — Telehealth: Payer: Self-pay | Admitting: Family Medicine

## 2022-05-28 DIAGNOSIS — M545 Low back pain, unspecified: Secondary | ICD-10-CM | POA: Insufficient documentation

## 2022-05-28 NOTE — Assessment & Plan Note (Signed)
She is having persistent lower back pain.  We talked about options.  Check plain films of her lower back and also of her right hip.  Steroid cautions discussed with patient.  Take 2 prednisone a day for 5 days, then 1 a day for 5 days, with food. Don't take with aleve/ibuprofen.  She can let me know how she feels and we will update her about her imaging.

## 2022-05-28 NOTE — Telephone Encounter (Signed)
Patient called in to follow up on her x-ray results. Please advise. Thank you!

## 2022-05-28 NOTE — Telephone Encounter (Signed)
I am awaiting the overread and we will contact her as soon as we can.  Thanks.

## 2022-05-29 ENCOUNTER — Encounter: Payer: Self-pay | Admitting: Family Medicine

## 2022-05-29 NOTE — Telephone Encounter (Signed)
Results were given to patient via mychart and patient has read results; also sent a message to Dr. Damita Dunnings about results.

## 2022-06-19 ENCOUNTER — Other Ambulatory Visit: Payer: Self-pay | Admitting: Family Medicine

## 2022-06-19 DIAGNOSIS — M545 Low back pain, unspecified: Secondary | ICD-10-CM

## 2022-06-19 MED ORDER — PREDNISONE 20 MG PO TABS: ORAL_TABLET | ORAL | 0 refills | Status: DC

## 2022-07-21 DIAGNOSIS — M533 Sacrococcygeal disorders, not elsewhere classified: Secondary | ICD-10-CM | POA: Diagnosis not present

## 2022-07-21 DIAGNOSIS — M545 Low back pain, unspecified: Secondary | ICD-10-CM | POA: Diagnosis not present

## 2022-07-21 DIAGNOSIS — M2550 Pain in unspecified joint: Secondary | ICD-10-CM | POA: Diagnosis not present

## 2022-08-08 DIAGNOSIS — M2560 Stiffness of unspecified joint, not elsewhere classified: Secondary | ICD-10-CM | POA: Diagnosis not present

## 2022-08-08 DIAGNOSIS — D8989 Other specified disorders involving the immune mechanism, not elsewhere classified: Secondary | ICD-10-CM | POA: Diagnosis not present

## 2022-08-08 DIAGNOSIS — R5383 Other fatigue: Secondary | ICD-10-CM | POA: Diagnosis not present

## 2022-08-08 DIAGNOSIS — Z111 Encounter for screening for respiratory tuberculosis: Secondary | ICD-10-CM | POA: Diagnosis not present

## 2022-08-08 DIAGNOSIS — R21 Rash and other nonspecific skin eruption: Secondary | ICD-10-CM | POA: Diagnosis not present

## 2022-08-08 DIAGNOSIS — M79641 Pain in right hand: Secondary | ICD-10-CM | POA: Diagnosis not present

## 2022-08-08 DIAGNOSIS — M2540 Effusion, unspecified joint: Secondary | ICD-10-CM | POA: Diagnosis not present

## 2022-08-08 DIAGNOSIS — Z79899 Other long term (current) drug therapy: Secondary | ICD-10-CM | POA: Diagnosis not present

## 2022-08-08 DIAGNOSIS — M0609 Rheumatoid arthritis without rheumatoid factor, multiple sites: Secondary | ICD-10-CM | POA: Diagnosis not present

## 2022-08-08 DIAGNOSIS — M79642 Pain in left hand: Secondary | ICD-10-CM | POA: Diagnosis not present

## 2022-08-13 ENCOUNTER — Other Ambulatory Visit: Payer: Self-pay | Admitting: Family Medicine

## 2022-08-25 DIAGNOSIS — M0609 Rheumatoid arthritis without rheumatoid factor, multiple sites: Secondary | ICD-10-CM | POA: Diagnosis not present

## 2022-08-25 DIAGNOSIS — R21 Rash and other nonspecific skin eruption: Secondary | ICD-10-CM | POA: Diagnosis not present

## 2022-08-25 DIAGNOSIS — I7300 Raynaud's syndrome without gangrene: Secondary | ICD-10-CM | POA: Diagnosis not present

## 2022-08-25 DIAGNOSIS — M2540 Effusion, unspecified joint: Secondary | ICD-10-CM | POA: Diagnosis not present

## 2022-09-18 ENCOUNTER — Other Ambulatory Visit: Payer: Self-pay | Admitting: Family Medicine

## 2022-09-24 ENCOUNTER — Encounter: Payer: Self-pay | Admitting: Emergency Medicine

## 2022-09-24 ENCOUNTER — Ambulatory Visit
Admission: EM | Admit: 2022-09-24 | Discharge: 2022-09-24 | Disposition: A | Discharge status: Home / Self Care | Payer: BC Managed Care – PPO | Attending: Internal Medicine | Admitting: Internal Medicine

## 2022-09-24 DIAGNOSIS — Z8619 Personal history of other infectious and parasitic diseases: Secondary | ICD-10-CM

## 2022-09-24 DIAGNOSIS — D849 Immunodeficiency, unspecified: Secondary | ICD-10-CM

## 2022-09-24 DIAGNOSIS — B029 Zoster without complications: Secondary | ICD-10-CM

## 2022-09-24 MED ORDER — VALACYCLOVIR HCL 1 G PO TABS: 1000.0000 mg | ORAL_TABLET | Freq: Three times a day (TID) | ORAL | 0 refills | Status: AC

## 2022-09-24 NOTE — Discharge Instructions (Addendum)
You have shingles. Take Valtrex antiviral 3 times daily for the next 10 days to treat this viral infection. Watch out for signs of bacterial infection such as redness, swelling, pus coming out of the blister, or fever/chills. Please schedule an appointment with your rheumatologist/primary care provider for the next 1 to 2 weeks for follow-up.  If you develop any new or worsening symptoms or if your symptoms do not start to improve, pleases return here or follow-up with your primary care provider. If your symptoms are severe, please go to the emergency room.

## 2022-09-24 NOTE — ED Triage Notes (Signed)
Pt reports a rash on back and abdominal area for a couple of days. States she was recently dx with RA. States she rash has a pins and needle sensation. Believes she may have shingles.

## 2022-09-24 NOTE — ED Provider Notes (Signed)
[facility]    CSN: [id number] Arrival date & time: 09/24/22  1204      History   Chief Complaint Chief Complaint  Patient presents with   Rash    HPI Krystal Mercado is a 52 y.o. female.   Patient presents to urgent care for evaluation of rash to the abdomen that started 2 days ago.  Rash started to the right middle abdomen but she feels as though it is wrapping around her right side and spreading to her back.  Rash has associated pins and needles/burning sensation and is uncomfortable.  No itching or drainage from the rash.  No recent use of new personal hygiene products or exposure to sick contacts with similar rash.  History of chickenpox, she has never had shingles in the past.  She recently was diagnosed with rheumatoid arthritis and started on hydroxychloroquine (Plaquenil).  She has not been vaccinated against shingles.  No eye involvement, fever, chills, nausea, vomiting, or bodyaches.  She has not attempted use of any over-the-counter medicines to help with rash.     Past Medical History:  Diagnosis Date   Allergy 2003   allergic rhinitis   Anxiety    Barrett's esophagus    Blood clot in vein    in thumb, had part of thumb amputated, no follow up   GERD (gastroesophageal reflux disease)    Observation or evaluation for suspected condition 04/28/2007   heme Evaluation no known pathology (Dr.Metjian, [facility])   Raynaud's disease    Skin necrosis (HCC)    left thumb- vascular eval at [facility]    Patient Active Problem List   Diagnosis Date Noted   Low back pain 05/28/2022   Contact with or exposure to mold 01/22/2022   Left hip pain 01/22/2022   Tachycardia 09/01/2021   Personal history of colonic polyps    Polyp of sigmoid colon    Acute non-recurrent sinusitis 04/22/2021   Bursitis 12/26/2020   Routine general medical examination at a health care facility 11/13/2019   Advance care planning 11/13/2019   Hyperlipidemia 11/13/2019   Complex  cyst of right ovary 10/09/2017   Benign neoplasm of transverse colon    Barrett's esophagus    SUI (stress urinary incontinence, female) 05/31/2017   Ocular migraine 05/31/2017   Radicular pain in left arm 05/31/2017   SKIN LESIONS, MULTIPLE 08/31/2007   RAYNAUDS SYNDROME 07/26/2007   VERTIGO 03/25/2007   Anxiety state 11/20/2006   TOBACCO ABUSE 09/22/2006   ALLERGIC RHINITIS 09/21/2006   ARTHRITIS 09/21/2006   HX, PERSONAL, TOBACCO USE 09/21/2006   HEADACHE, CHRONIC, HX OF 09/21/2006    Past Surgical History:  Procedure Laterality Date   ABDOMINAL HYSTERECTOMY     CHOLECYSTECTOMY  1995   conization, BTL   COLONOSCOPY N/A 05/20/2021   Procedure: COLONOSCOPYn WITH BIOPSY;  Surgeon: Midge Minium, MD;  Location: [facility];  Service: Endoscopy;  Laterality: N/A;   COLONOSCOPY WITH PROPOFOL N/A 08/13/2017   Procedure: COLONOSCOPY WITH PROPOFOL;  Surgeon: Midge Minium, MD;  Location: [facility];  Service: Endoscopy;  Laterality: N/A;   CYSTECTOMY  1990   cyst in fallopian tube removed, Tube intact   ESOPHAGOGASTRODUODENOSCOPY N/A 05/20/2021   Procedure: ESOPHAGOGASTRODUODENOSCOPY (EGD) WITH BIOPSY;  Surgeon: Midge Minium, MD;  Location: [facility];  Service: Endoscopy;  Laterality: N/A;   ESOPHAGOGASTRODUODENOSCOPY (EGD) WITH PROPOFOL N/A 08/13/2017   Procedure: ESOPHAGOGASTRODUODENOSCOPY (EGD) WITH PROPOFOL;  Surgeon: Midge Minium, MD;  Location: [facility];  Service: Endoscopy;  Laterality: N/A;   GROIN DISSECTION  05/2000   Right groin Lymph node dissection for cat scratch Disease   MRI  07/2004   negative except sinusitis   POLYPECTOMY N/A 08/13/2017   Procedure: POLYPECTOMY;  Surgeon: Midge Minium, MD;  Location: [facility];  Service: Endoscopy;  Laterality: N/A;   POLYPECTOMY N/A 05/20/2021   Procedure: POLYPECTOMY;  Surgeon: Midge Minium, MD;  Location: [facility];  Service: Endoscopy;  Laterality: N/A;   Thoracic aortogram   09/23/2006   Thoracic aortogram with select LUE arteriogram small radial artery below the brach bifurcation   TUBAL LIGATION  1995   conization, BTL; choleycystectomy   VAGINAL DELIVERY     X's 2    OB History     Gravida  2   Para  2   Term  2   Preterm      AB      Living  2      SAB      IAB      Ectopic      Multiple      Live Births               Home Medications    Prior to Admission medications   Medication Sig Start Date End Date Taking? Authorizing Provider  valACYclovir (VALTREX) 1000 MG tablet Take 1 tablet (1,000 mg total) by mouth 3 (three) times daily for 10 days. 09/24/22 10/04/22 Yes Inika Bellanger, Donavan Burnet, FNP  metoprolol tartrate (LOPRESSOR) 25 MG tablet TAKE 0.5-1 TABLETS (12.5-25 MG TOTAL) BY MOUTH 2 (TWO) TIMES DAILY AS NEEDED (FOR FAST HEART RATE). 08/13/22   Joaquim Nam, MD  Multiple Vitamin (MULTIVITAMIN) tablet Take 1 tablet by mouth daily.    [provider]  Omega-3 Fatty Acids (FISH OIL) 1000 MG CAPS Take 1 capsule (1,000 mg total) by mouth 2 (two) times daily. 12/31/20   Joaquim Nam, MD  pantoprazole (PROTONIX) 40 MG tablet TAKE 1 TABLET BY MOUTH EVERY DAY 09/19/22   Joaquim Nam, MD  predniSONE (DELTASONE) 20 MG tablet Take 2 a day for 5 days, then 1 a day for 5 days, with food. Don't take with aleve/ibuprofen. 06/19/22   Joaquim Nam, MD  sertraline (ZOLOFT) 100 MG tablet TAKE 1.5 TABLETS (150 MG TOTAL) BY MOUTH DAILY 09/19/22   Joaquim Nam, MD    Family History Family History  Problem Relation Age of Onset   Cancer Mother 72       breast cancer/lymphoma disease 10/2004   Breast cancer Mother    Hypertension Father    Stroke Father    Cancer Father    Alcohol abuse Brother    Cancer Maternal Grandmother        bone cancer //young in 30's   Alcohol abuse Maternal Grandmother    Stroke Maternal Grandmother    Colon cancer Maternal Grandfather    Cancer Maternal Grandfather        ? bone cancer./  prostate/colon cancer    Social History Social History   Tobacco Use   Smoking status: Every Day    Types: E-cigarettes   Smokeless tobacco: Never   Tobacco comments:    Former smoker as of 2022.  30+ pack years.    Vaping Use   Vaping Use: Never used  Substance Use Topics   Alcohol use: Yes    Alcohol/week: 1.0 - 2.0 standard drink of alcohol    Types: 1 - 2  Glasses of wine per week    Comment: wine at night   Drug use: No     Allergies   Chantix [varenicline tartrate]   Review of Systems Review of Systems Per HPI  Physical Exam Triage Vital Signs ED Triage Vitals  Enc Vitals Group     BP 09/24/22 1217 (!) 155/91     Pulse Rate 09/24/22 1217 70     Resp 09/24/22 1217 18     Temp 09/24/22 1217 98.7 F (37.1 C)     Temp Source 09/24/22 1217 Oral     SpO2 09/24/22 1217 96 %     Weight --      Height --      Head Circumference --      Peak Flow --      Pain Score 09/24/22 1216 4     Pain Loc --      Pain Edu? --      Excl. in GC? --    No data found.  Updated Vital Signs BP (!) 155/91 (BP Location: Left Arm)   Pulse 70   Temp 98.7 F (37.1 C) (Oral)   Resp 18   SpO2 96%   Visual Acuity Right Eye Distance:   Left Eye Distance:   Bilateral Distance:    Right Eye Near:   Left Eye Near:    Bilateral Near:     Physical Exam Vitals and nursing note reviewed.  Constitutional:      Appearance: She is not ill-appearing or toxic-appearing.  HENT:     Head: Normocephalic and atraumatic.     Right Ear: Hearing and external ear normal.     Left Ear: Hearing and external ear normal.     Nose: Nose normal.     Mouth/Throat:     Lips: Pink.  Eyes:     General: Lids are normal. Vision grossly intact. Gaze aligned appropriately.     Extraocular Movements: Extraocular movements intact.     Conjunctiva/sclera: Conjunctivae normal.  Cardiovascular:     Rate and Rhythm: Normal rate and regular rhythm.     Heart sounds: Normal heart sounds, S1 normal and  S2 normal.  Pulmonary:     Effort: Pulmonary effort is normal. No respiratory distress.     Breath sounds: Normal breath sounds and air entry.  Musculoskeletal:     Cervical back: Neck supple.  Skin:    General: Skin is warm and dry.     Capillary Refill: Capillary refill takes less than 2 seconds.     Findings: Rash present.     Comments: Papulovesicular rash in a cluster to the right middle abdomen on an erythematous base. See image below.  Rash is along the T8/T9 dermatome on the right side.  Neurological:     General: No focal deficit present.     Mental Status: She is alert and oriented to person, place, and time. Mental status is at baseline.     Cranial Nerves: No dysarthria or facial asymmetry.  Psychiatric:        Mood and Affect: Mood normal.        Speech: Speech normal.        Behavior: Behavior normal.        Thought Content: Thought content normal.        Judgment: Judgment normal.      UC Treatments / Results  Labs (all labs ordered are listed, but only abnormal results are displayed) Labs Reviewed - No data to  display  EKG   Radiology No results found.  Procedures Procedures (including critical care time)  Medications Ordered in UC Medications - No data to display  Initial Impression / Assessment and Plan / UC Course  I have reviewed the triage vital signs and the nursing notes.  Pertinent labs & imaging results that were available during my care of the patient were reviewed by me and considered in my medical decision making (see chart for details).   1.  Herpes zoster without complication, immunosuppression, history of chickenpox Presentation is consistent with herpes zoster.  Patient is immunocompromise, therefore will use 10 days of Valtrex 1 g every 8 hours.  Advised to take this medicine with food to avoid stomach upset.  Watch out for signs of secondary bacterial infection.  Advised to follow-up with rheumatology/PCP in the next 1 to 2 weeks for  further evaluation and to discuss shingles vaccine.  Vital signs hemodynamically stable.  May use Tylenol as needed for pain.  Discussed red flag signs and symptoms of worsening condition,when to call the PCP office, return to urgent care, and when to seek higher level of care in the emergency department. Counseled patient regarding appropriate use of medications and potential side effects for all medications recommended or prescribed today. Patient verbalizes understanding and agreement with plan. Discharged in stable condition.    Final Clinical Impressions(s) / UC Diagnoses   Final diagnoses:  Herpes zoster without complication  Immunosuppression (HCC)  History of chicken pox     Discharge Instructions      You have shingles. Take Valtrex antiviral 3 times daily for the next 10 days to treat this viral infection. Watch out for signs of bacterial infection such as redness, swelling, pus coming out of the blister, or fever/chills. Please schedule an appointment with your rheumatologist/primary care provider for the next 1 to 2 weeks for follow-up.       ED Prescriptions     Medication Sig Dispense Auth. Provider   valACYclovir (VALTREX) 1000 MG tablet Take 1 tablet (1,000 mg total) by mouth 3 (three) times daily for 10 days. 30 tablet Carlisle Beers, FNP      PDMP not reviewed this encounter.   Carlisle Beers, [place] 09/24/22 1310

## 2022-10-08 ENCOUNTER — Other Ambulatory Visit: Payer: Self-pay | Admitting: Family Medicine

## 2022-10-18 IMAGING — CR DG CHEST 2V
1 series · 2 of 2 positions shown · non-contrast
Comparison: None.

CLINICAL DATA: Chest pain

EXAM:
CHEST - 2 VIEW

[Series 1: dg chest 2 view · 0.14mm/px · 2 of 2 slices shown]
[im 1/2]
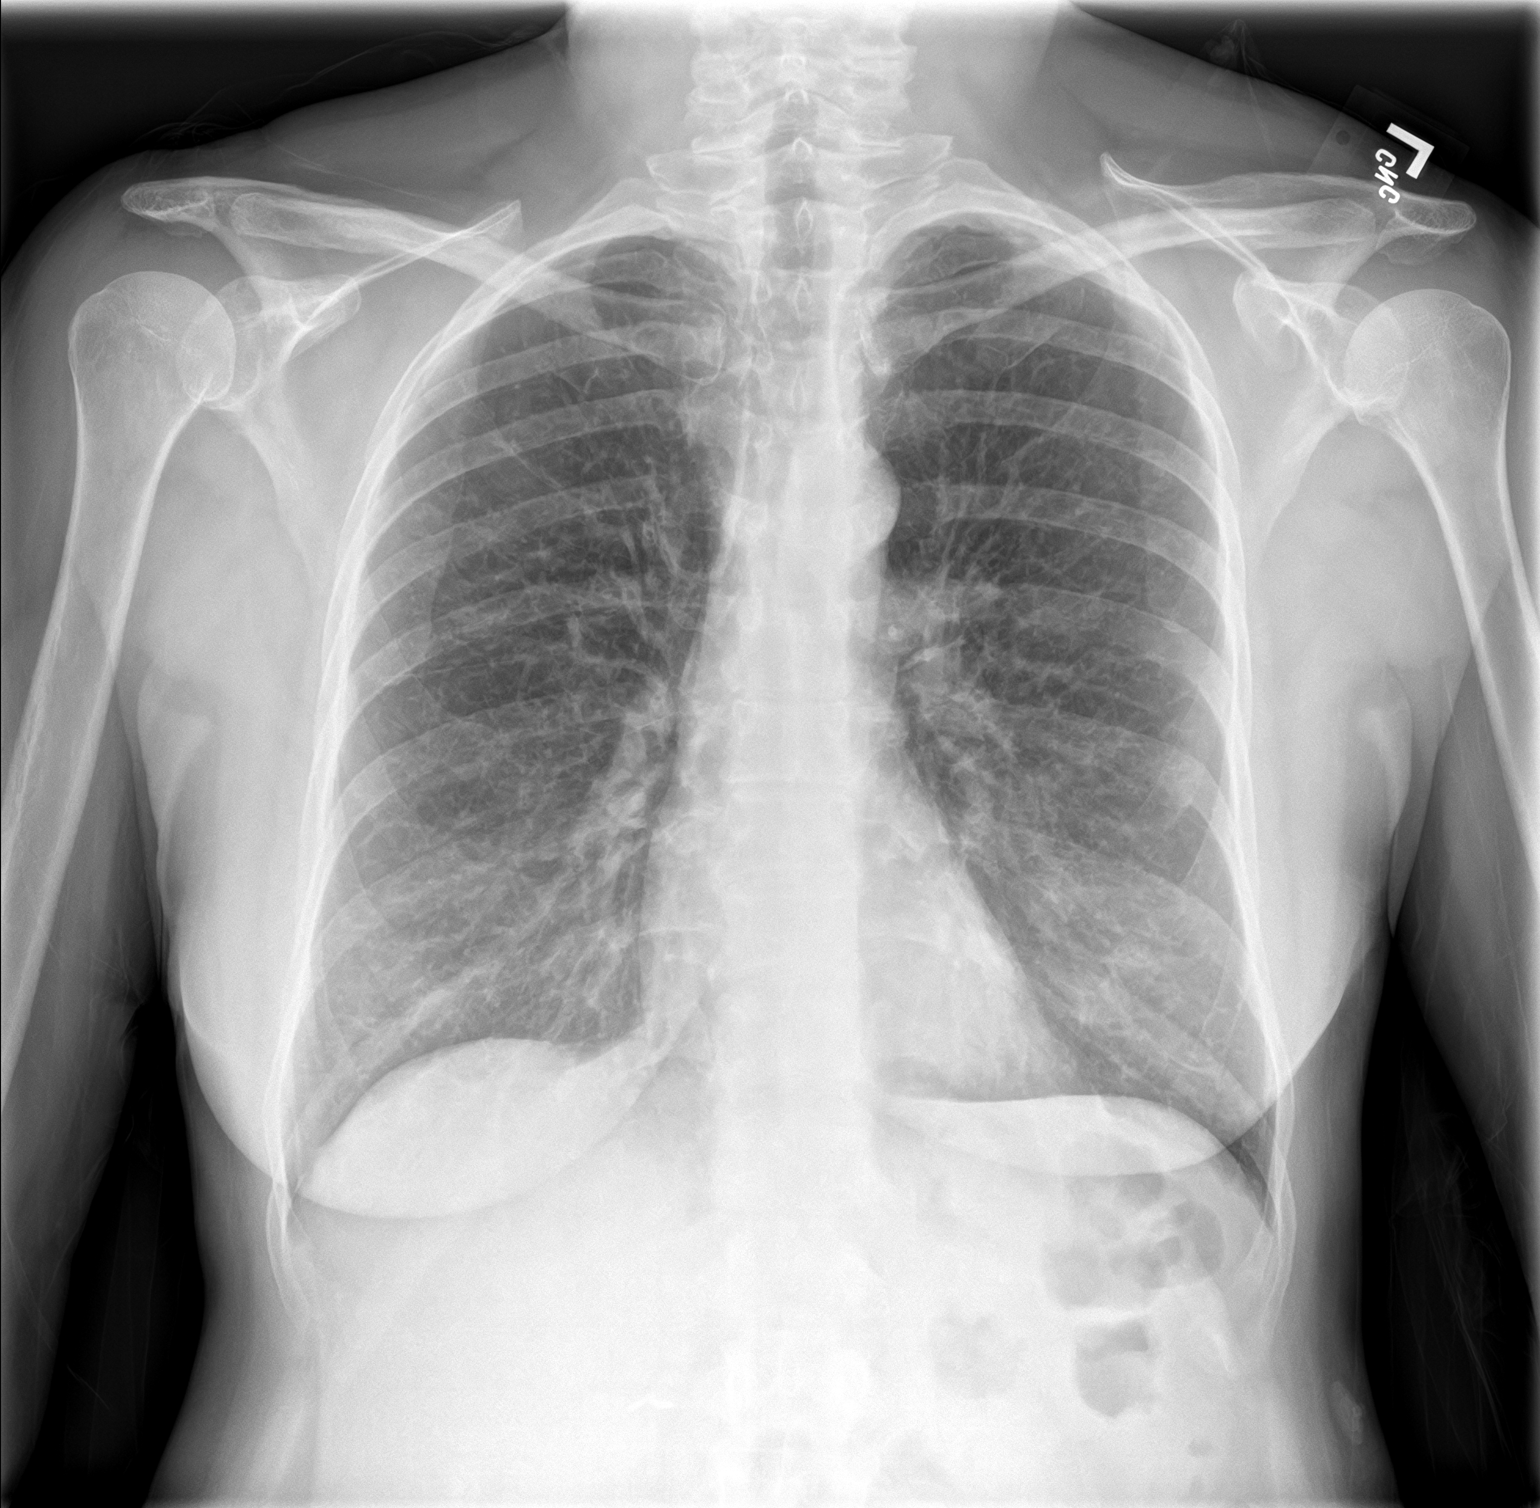
[im 2/2]
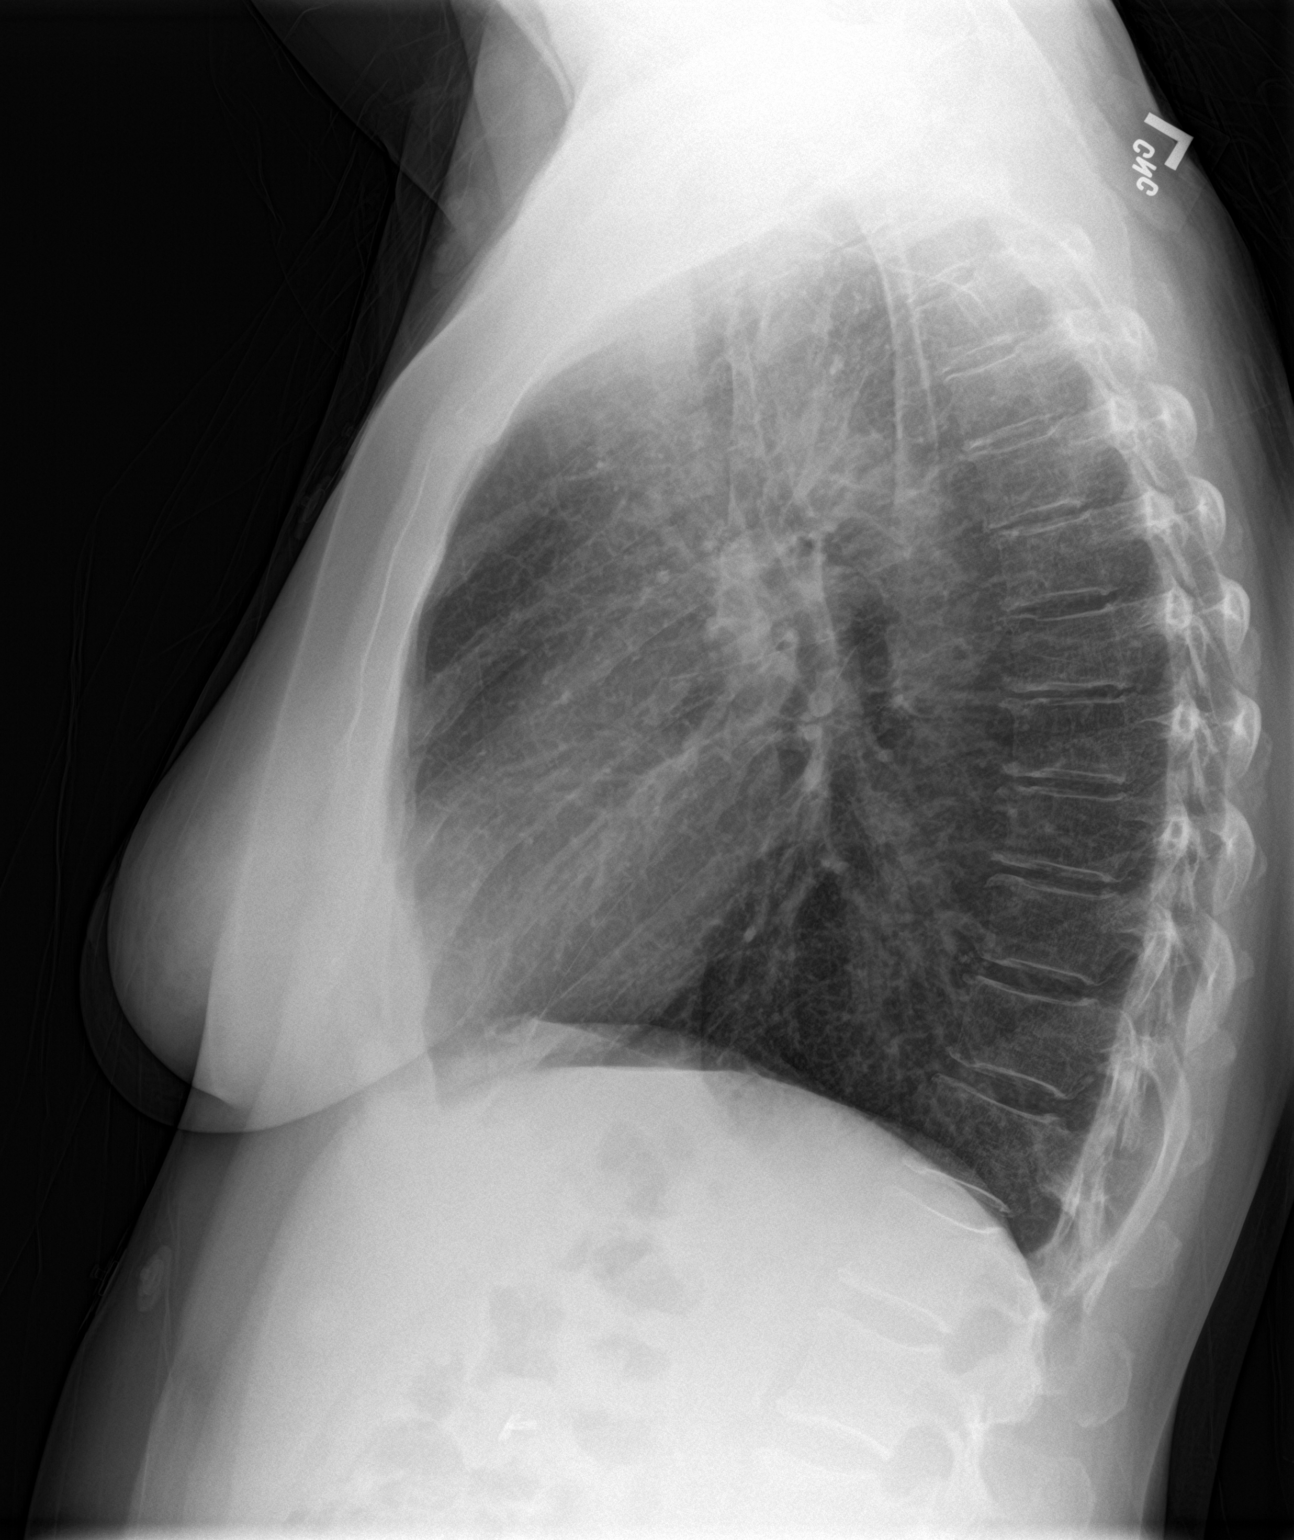

[2 of 2 positions shown; findings below may reference images not displayed]

FINDINGS: The heart size and mediastinal contours are within normal limits.
Both lungs are clear. The visualized skeletal structures are
unremarkable.
IMPRESSION: No acute abnormality of the lungs.

## 2022-10-20 ENCOUNTER — Other Ambulatory Visit: Payer: Self-pay | Admitting: Family Medicine

## 2022-10-20 IMAGING — CT CT ANGIO CHEST
2 of 7 series · 18 of 46 positions shown · IV contrast (APPLIED)
Comparison: Chest radiograph March 23, 2021

CLINICAL DATA: Generalized chest tightness and shortness of breath
since this weekend.

EXAM:
CT ANGIOGRAPHY CHEST WITH CONTRAST
TECHNIQUE: Multidetector CT imaging of the chest was performed using the
standard protocol during bolus administration of intravenous
contrast. Multiplanar CT image reconstructions and MIPs were
obtained to evaluate the vascular anatomy.
CONTRAST:  75mL OMNIPAQUE IOHEXOL 350 MG/ML SOLN

[Series 6: thins · axial · 0.67mm/px · z∈[-269,+15]mm · 15 of 395 slices shown]
[im 20/395  lung]
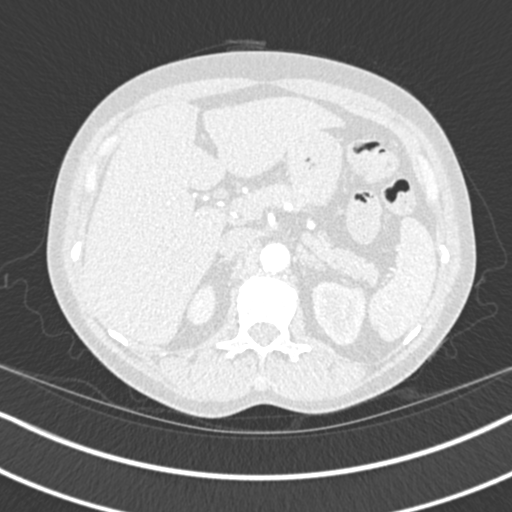
[im 40/395  soft-tissue]
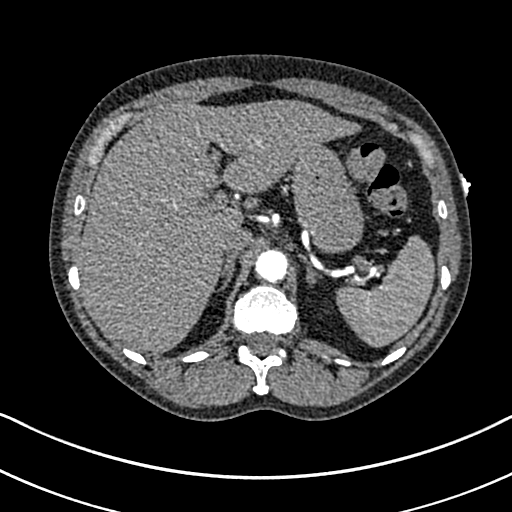
[im 79/395  lung]
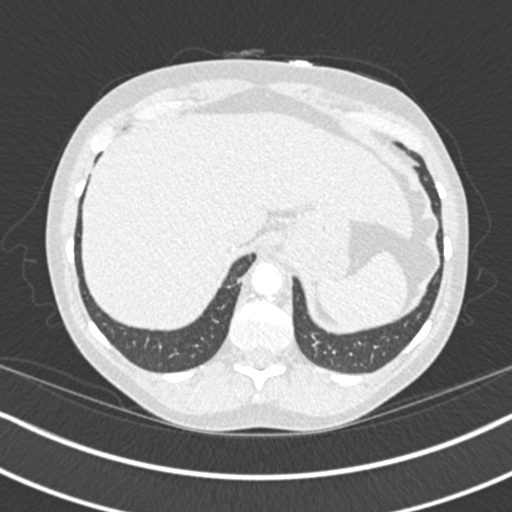
[im 99/395  soft-tissue]
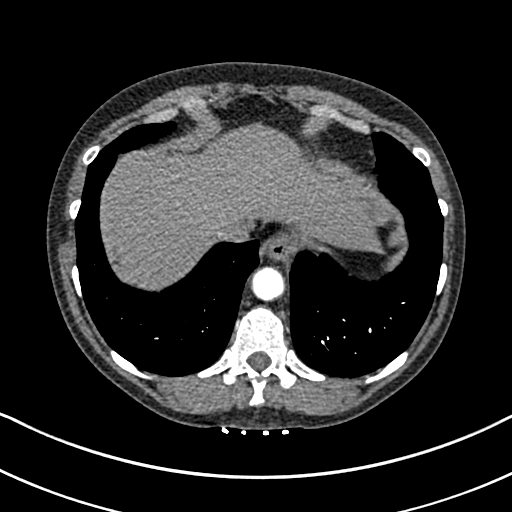
[im 119/395  lung]
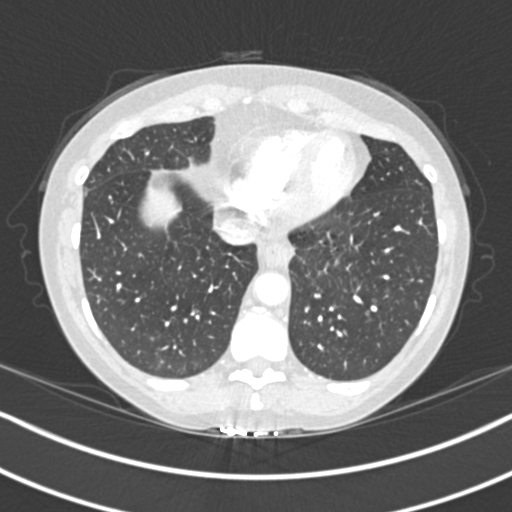
[im 138/395  soft-tissue]
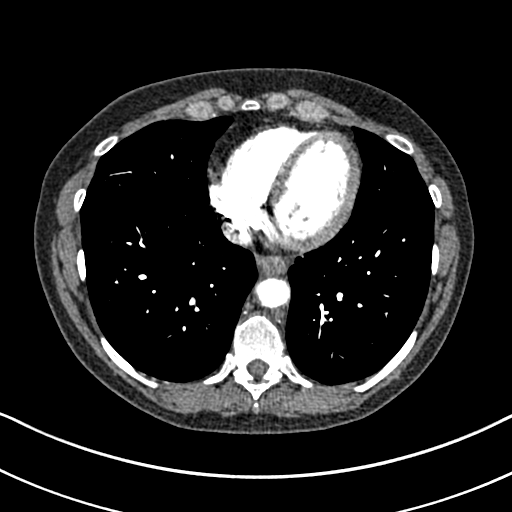
[im 178/395  lung]
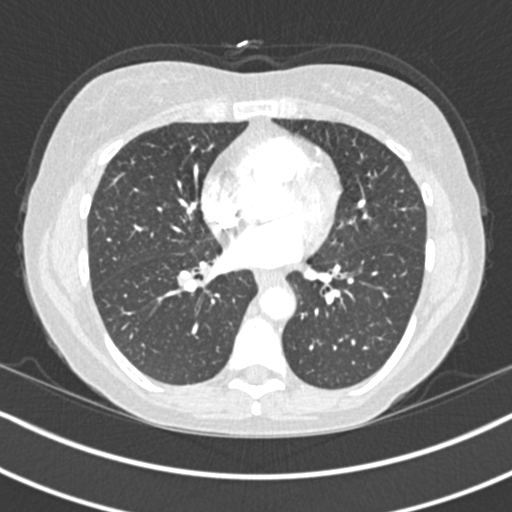
[im 198/395  soft-tissue]
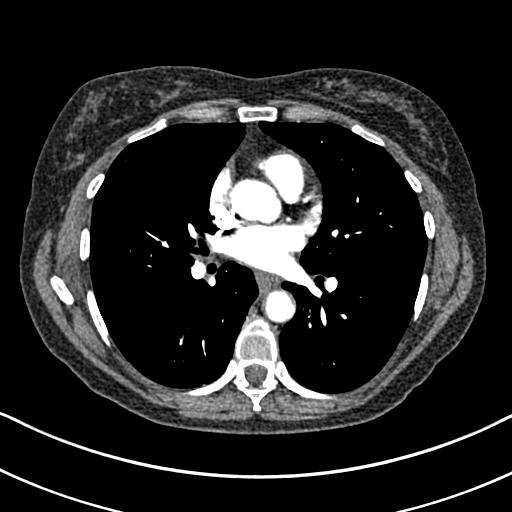
[im 217/395  lung]
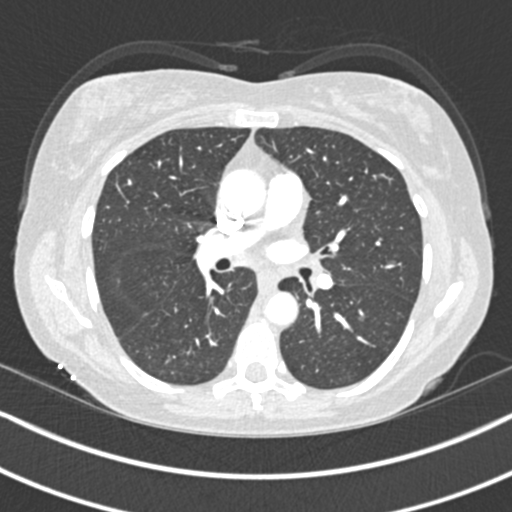
[im 257/395  soft-tissue]
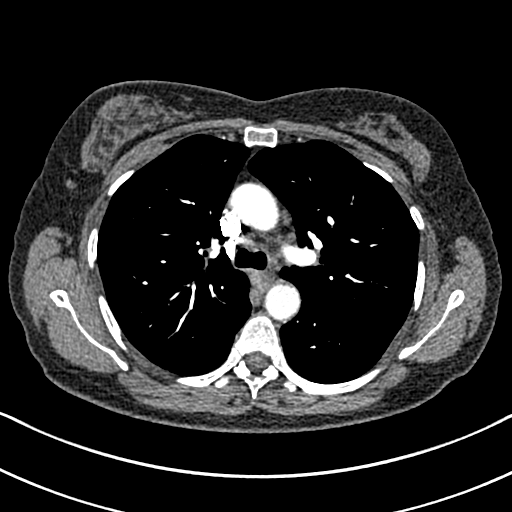
[im 276/395  lung]
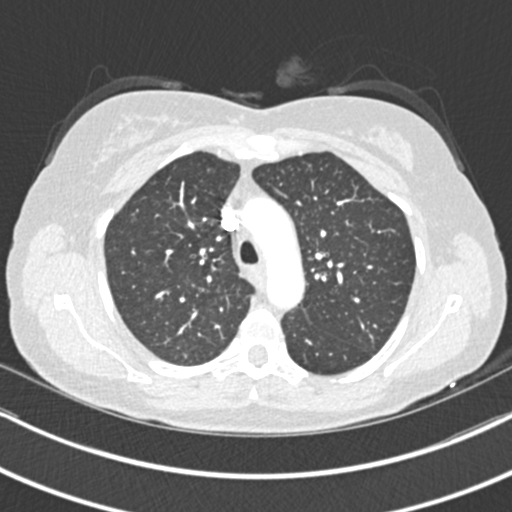
[im 296/395  soft-tissue]
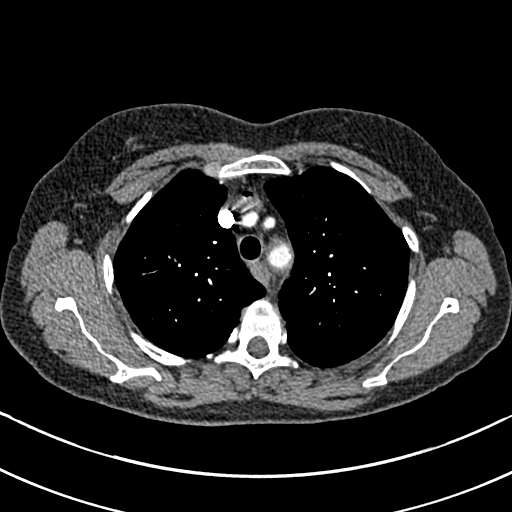
[im 316/395  lung]
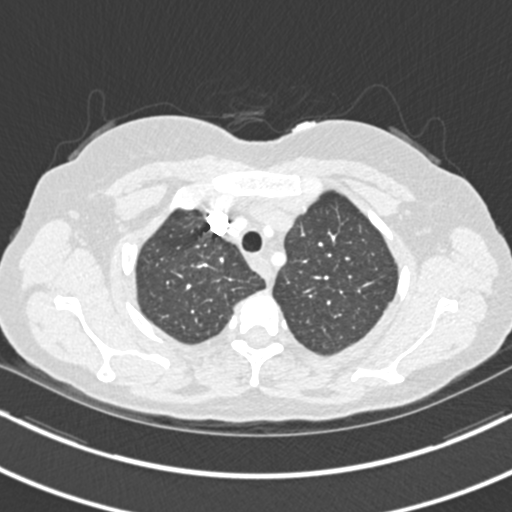
[im 355/395  soft-tissue]
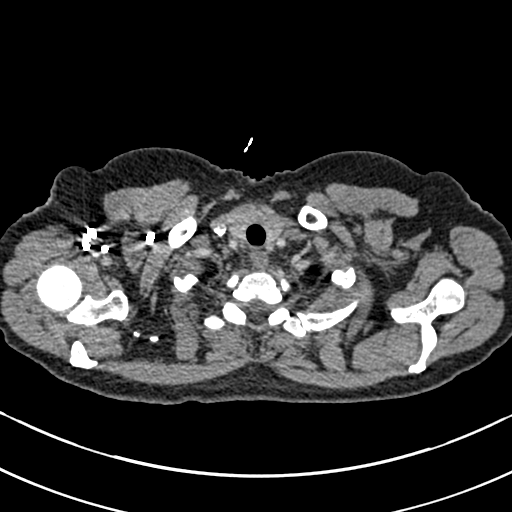
[im 375/395  lung]
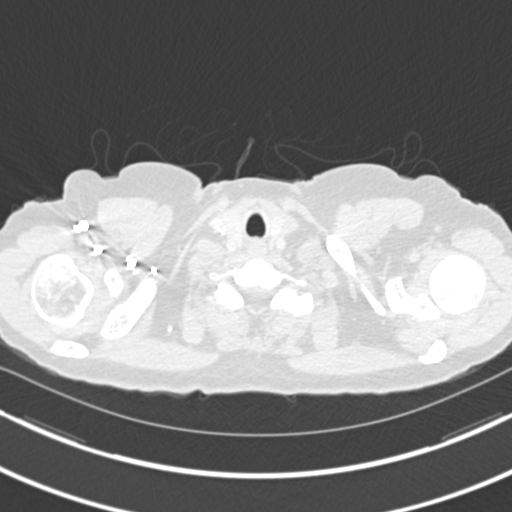

[Series 8: coronal mpr · coronal · 0.62mm/px · 3 of 106 slices shown]
[im 27/106  soft-tissue]
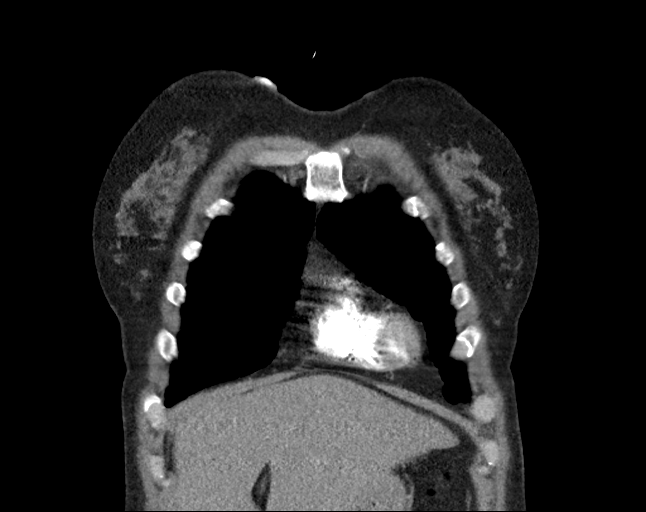
[im 53/106  soft-tissue]
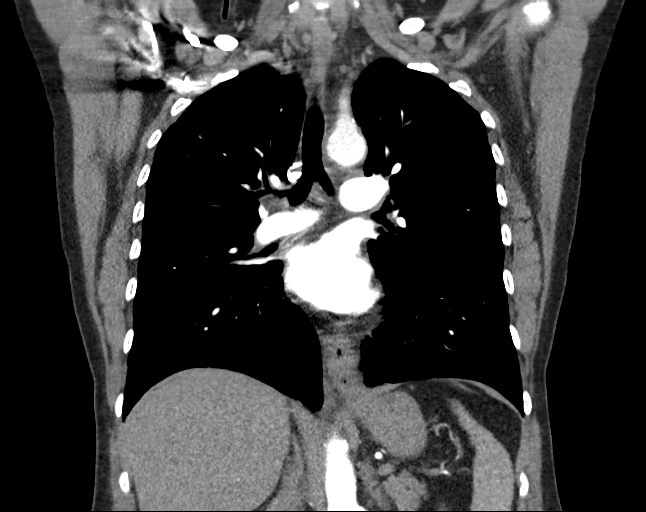
[im 79/106  soft-tissue]
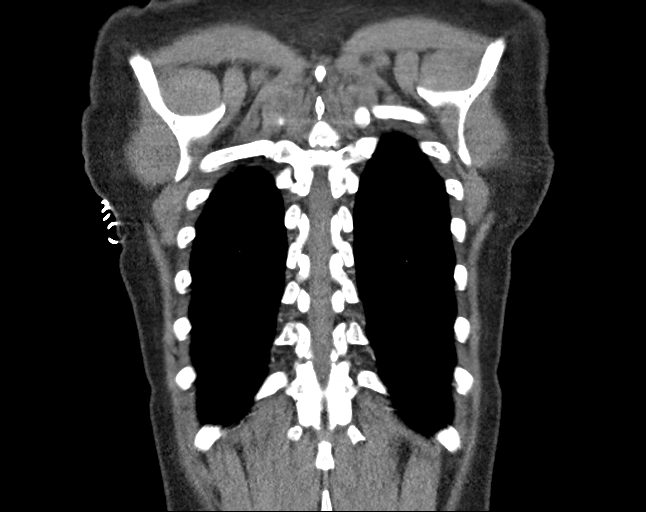

[18 of 46 positions shown; findings below may reference images not displayed]

FINDINGS: Cardiovascular: Satisfactory opacification of the pulmonary arteries
to the segmental level. No evidence of pulmonary embolism. Aortic
atherosclerosis without thoracic aortic aneurysm. Normal heart size.
No pericardial effusion.

Mediastinum/Nodes: No discrete thyroid nodule. No pathologically
enlarged mediastinal, axillary lymph nodes. Mild symmetric
esophageal wall thickening, nonspecific possibly reflecting
esophagitis.

Lungs/Pleura: Mild biapical pleuroparenchymal scarring. Scattered
patchy ground-glass opacities in the bilateral lung bases, likely
infectious or inflammatory. No pleural effusion. No pneumothorax.

Upper Abdomen: No acute abnormality.

Musculoskeletal: No chest wall abnormality. No acute or significant
osseous findings.

Review of the MIP images confirms the above findings.
IMPRESSION: 1. No evidence of pulmonary embolism.
2. Scattered patchy ground-glass opacities in the bilateral lung
bases, likely infectious or inflammatory.
3. Mild symmetric esophageal wall thickening, nonspecific possibly
reflecting esophagitis.
4.  Aortic Atherosclerosis ([id number]).

## 2022-10-27 DIAGNOSIS — M0609 Rheumatoid arthritis without rheumatoid factor, multiple sites: Secondary | ICD-10-CM | POA: Diagnosis not present

## 2022-10-27 DIAGNOSIS — M542 Cervicalgia: Secondary | ICD-10-CM | POA: Diagnosis not present

## 2022-10-27 DIAGNOSIS — M79642 Pain in left hand: Secondary | ICD-10-CM | POA: Diagnosis not present

## 2022-10-27 DIAGNOSIS — M79641 Pain in right hand: Secondary | ICD-10-CM | POA: Diagnosis not present

## 2022-10-27 DIAGNOSIS — M2540 Effusion, unspecified joint: Secondary | ICD-10-CM | POA: Diagnosis not present

## 2022-11-05 DIAGNOSIS — M47812 Spondylosis without myelopathy or radiculopathy, cervical region: Secondary | ICD-10-CM | POA: Diagnosis not present

## 2022-11-12 ENCOUNTER — Other Ambulatory Visit: Payer: Self-pay

## 2022-11-12 ENCOUNTER — Ambulatory Visit: Payer: BC Managed Care – PPO

## 2022-11-12 DIAGNOSIS — R252 Cramp and spasm: Secondary | ICD-10-CM | POA: Insufficient documentation

## 2022-11-12 DIAGNOSIS — R293 Abnormal posture: Secondary | ICD-10-CM | POA: Insufficient documentation

## 2022-11-12 DIAGNOSIS — M6281 Muscle weakness (generalized): Secondary | ICD-10-CM | POA: Diagnosis not present

## 2022-11-12 DIAGNOSIS — M542 Cervicalgia: Secondary | ICD-10-CM | POA: Diagnosis not present

## 2022-11-12 NOTE — Therapy (Signed)
OUTPATIENT PHYSICAL THERAPY CERVICAL EVALUATION   Patient Name: Krystal Mercado MRN: [id number] DOB:September 27, 1970, 52 y.o., female Today's Date: 11/12/2022  END OF SESSION:  PT End of Session - 11/12/22 1405     Visit Number 1    Date for PT Re-Evaluation 01/07/23    Authorization Type [employer]    Progress Note Due on Visit 10    PT Start Time 1405    PT Stop Time 1445    PT Time Calculation (min) 40 min    Activity Tolerance Patient tolerated treatment well    Behavior During Therapy [facility] for tasks assessed/performed             Past Medical History:  Diagnosis Date   Allergy 2003   allergic rhinitis   Anxiety    Barrett's esophagus    Blood clot in vein    in thumb, had part of thumb amputated, no follow up   GERD (gastroesophageal reflux disease)    Observation or evaluation for suspected condition 04/28/2007   heme Evaluation no known pathology (Dr.Metjian, [facility])   Raynaud's disease    Skin necrosis (HCC)    left thumb- vascular eval at [facility]   Past Surgical History:  Procedure Laterality Date   ABDOMINAL HYSTERECTOMY     CHOLECYSTECTOMY  1995   conization, BTL   COLONOSCOPY N/A 05/20/2021   Procedure: COLONOSCOPYn WITH BIOPSY;  Surgeon: Midge Minium, MD;  Location: [facility];  Service: Endoscopy;  Laterality: N/A;   COLONOSCOPY WITH PROPOFOL N/A 08/13/2017   Procedure: COLONOSCOPY WITH PROPOFOL;  Surgeon: Midge Minium, MD;  Location: [facility];  Service: Endoscopy;  Laterality: N/A;   CYSTECTOMY  1990   cyst in fallopian tube removed, Tube intact   ESOPHAGOGASTRODUODENOSCOPY N/A 05/20/2021   Procedure: ESOPHAGOGASTRODUODENOSCOPY (EGD) WITH BIOPSY;  Surgeon: Midge Minium, MD;  Location: [facility];  Service: Endoscopy;  Laterality: N/A;   ESOPHAGOGASTRODUODENOSCOPY (EGD) WITH PROPOFOL N/A 08/13/2017   Procedure: ESOPHAGOGASTRODUODENOSCOPY (EGD) WITH PROPOFOL;  Surgeon: Midge Minium, MD;  Location: [facility];  Service:  Endoscopy;  Laterality: N/A;   GROIN DISSECTION  05/2000   Right groin Lymph node dissection for cat scratch Disease   MRI  07/2004   negative except sinusitis   POLYPECTOMY N/A 08/13/2017   Procedure: POLYPECTOMY;  Surgeon: Midge Minium, MD;  Location: [facility];  Service: Endoscopy;  Laterality: N/A;   POLYPECTOMY N/A 05/20/2021   Procedure: POLYPECTOMY;  Surgeon: Midge Minium, MD;  Location: [facility];  Service: Endoscopy;  Laterality: N/A;   Thoracic aortogram  09/23/2006   Thoracic aortogram with select LUE arteriogram small radial artery below the brach bifurcation   TUBAL LIGATION  1995   conization, BTL; choleycystectomy   VAGINAL DELIVERY     X's 2   Patient Active Problem List   Diagnosis Date Noted   Low back pain 05/28/2022   Contact with or exposure to mold 01/22/2022   Left hip pain 01/22/2022   Tachycardia 09/01/2021   Personal history of colonic polyps    Polyp of sigmoid colon    Acute non-recurrent sinusitis 04/22/2021   Bursitis 12/26/2020   Routine general medical examination at a health care facility 11/13/2019   Advance care planning 11/13/2019   Hyperlipidemia 11/13/2019   Complex cyst of right ovary 10/09/2017   Benign neoplasm of transverse colon    Barrett's esophagus    SUI (stress urinary incontinence, female) 05/31/2017   Ocular migraine 05/31/2017   Radicular pain in left  arm 05/31/2017   SKIN LESIONS, MULTIPLE 08/31/2007   RAYNAUDS SYNDROME 07/26/2007   VERTIGO 03/25/2007   Anxiety state 11/20/2006   TOBACCO ABUSE 09/22/2006   ALLERGIC RHINITIS 09/21/2006   ARTHRITIS 09/21/2006   HX, PERSONAL, TOBACCO USE 09/21/2006   HEADACHE, CHRONIC, HX OF 09/21/2006    PCP: Crawford Givens, MD  REFERRING PROVIDER: Lisbeth Renshaw, MD  REFERRING DIAG: [phone] (ICD-10-CM) - Spondylosis without myelopathy or radiculopathy, cervical region  THERAPY DIAG:  Cervicalgia - Plan: PT plan of care cert/re-cert  Cramp and spasm - Plan: PT  plan of care cert/re-cert  Muscle weakness (generalized) - Plan: PT plan of care cert/re-cert  Abnormal posture - Plan: PT plan of care cert/re-cert  Rationale for Evaluation and Treatment: Rehabilitation  ONSET DATE: 11/11/2022  SUBJECTIVE:                                                                                                                                                                                                         SUBJECTIVE STATEMENT: Pain in neck for a while.  I was diagnosed with RA several months ago.   I have pain in all of my joints but my doctor did not think the pain in my neck was related to the RA.  She also c/o numbness in bilateral ulnar nerve distribution.  She works as a Transport planner.  She admits she has a lot of stress in life currently with her health issues and her sons new baby had to be in the NICU and soon after, the son had blood clots in both lungs.  She has frequent headaches as well and is a smoker.    Hand dominance: Right  PERTINENT HISTORY:  See above: multiple medical issues  PAIN:  Are you having pain? Yes: NPRS scale: 0/10 Pain location: neck  Pain description: increases to 4-5/10 at times, nagging gnawing Aggravating factors: sitting up Relieving factors: showers, ice packs, biofreeze, lidocaine, anti inflammtory  PRECAUTIONS: None  RED FLAGS: None     WEIGHT BEARING RESTRICTIONS: No  FALLS:  Has patient fallen in last 6 months? 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)  PLOF: Independent, Independent with basic ADLs, Independent with household mobility without device, Independent with community mobility without device, Independent with homemaking with ambulation, Independent with gait, and Independent with transfers  PATIENT GOALS: to reduce pain to manageable  NEXT MD VISIT: na  OBJECTIVE:   DIAGNOSTIC  FINDINGS:  No recent imaging of the cervical  spine  PATIENT SURVEYS:  FOTO 41, predicted 51 score based on patient feedback on current level of activity  COGNITION: Overall cognitive status: Within functional limits for tasks assessed  SENSATION: WFL  POSTURE:  elevated shoulders  PALPATION: Taut bands and trigger point bilateral upper traps and parascapular areas.     CERVICAL ROM:   Active ROM A/PROM (deg) eval  Flexion 45  Extension 50  Right lateral flexion 30  Left lateral flexion 32  Right rotation 45  Left rotation 40   (Blank rows = not tested)  UPPER EXTREMITY ROM:  WNL  UPPER EXTREMITY MMT: Shoulders generally 3+ to 4-/5,  elbow, forearm and wrist generally 4/5,    CERVICAL SPECIAL TESTS:  Spurling's test: Negative   TODAY'S TREATMENT:                                                                                                                              DATE: 11/12/22 Initial eval completed and initiated HEP   PATIENT EDUCATION:  Education details: Initiated HEP Person educated: Patient Education method: Programmer, multimedia, Facilities manager, Verbal cues, and Handouts Education comprehension: verbalized understanding, returned demonstration, and verbal cues required  HOME EXERCISE PROGRAM: Access Code: [email or web address] URL: [email or web address] Date: 11/12/2022 Prepared by: Mikey Kirschner  Exercises - Scapular Retraction with Resistance Advanced  - 2 x daily - 7 x weekly - 2 sets - 10 reps - Scapular Retraction with Resistance  - 2 x daily - 7 x weekly - 2 sets - 10 reps - Shoulder External Rotation and Scapular Retraction with Resistance  - 2 x daily - 7 x weekly - 2 sets - 10 reps - Standing Shoulder Horizontal Abduction with Resistance  - 1 x daily - 7 x weekly - 2 sets - 10 reps - Seated Passive Cervical Retraction  - 1 x daily - 7 x weekly - 3 sets - 10 reps - Seated Cervical Rotation AROM  - 1 x daily - 7 x weekly - 3 sets - 10 reps -  Seated Cervical Sidebending AROM  - 1 x daily - 7 x weekly - 3 sets - 10 reps - Seated Cervical Sidebending Stretch  - 1 x daily - 7 x weekly - 3 sets - 10 reps  ASSESSMENT:  CLINICAL IMPRESSION: Patient is a 52 y.o. female who was seen today for physical therapy evaluation and treatment for cervical pain with questionable radiculopathy.  She presents with decreased c spine ROM especially with side bending, decreased postural and UE strength,  taut bands and trigger points bilateral upper trap and postural musculature and elevated pain.  She was recently diagnosed with RA but no current joint deformities.  She reports chronic pain and fatigue for several years.  She is a smoker.  She would benefit from skilled PT for postural strengthening, cervical ROM, postural and shoulder strengthening and pain control using manual techniques to include dry needling.    OBJECTIVE IMPAIRMENTS: cardiopulmonary status limiting activity,  decreased activity tolerance, decreased endurance, decreased ROM, decreased strength, increased fascial restrictions, increased muscle spasms, impaired sensation, impaired UE functional use, postural dysfunction, and pain.   ACTIVITY LIMITATIONS: carrying, lifting, standing, sleeping, transfers, bed mobility, bathing, dressing, reach over head, and hygiene/grooming  PARTICIPATION LIMITATIONS: meal prep, cleaning, laundry, driving, shopping, community activity, occupation, and yard work  PERSONAL FACTORS: Behavior pattern, Fitness, Past/current experiences, Profession, and 3+ comorbidities: see multiple medical issues above, these include anxiety, depression, smoking, RA  are also affecting patient's functional outcome.   REHAB POTENTIAL: Fair due to chronic pain history  CLINICAL DECISION MAKING: Evolving/moderate complexity  EVALUATION COMPLEXITY: Moderate   GOALS: Goals reviewed with patient? Yes  SHORT TERM GOALS: Target date: 12/10/2022   Pain report to be no greater  than 4/10  Baseline:  Goal status: INITIAL  2.  Patient will be independent with initial HEP  Baseline:  Goal status: INITIAL   LONG TERM GOALS: Target date: 01/07/2023   Patient to report pain no greater than 2/10  Baseline:  Goal status: INITIAL  2.  Patient to be independent with advanced HEP  Baseline:  Goal status: INITIAL  3.  C spine ROM to improve by 3-5 degrees on rotation and side bending Baseline:  Goal status: INITIAL  4.  Patient to report 85% improvement in overall symptoms Baseline:  Goal status: INITIAL  5.  UE strength to improve to 4/5 bilateral shoulders and 4+/5 bilateral elbow, wrist and hand Baseline:  Goal status: INITIAL  6.  Patient to be able to sleep through the night  Baseline:  Goal status: INITIAL   PLAN:  PT FREQUENCY: 1-2x/week  PT DURATION: 8 weeks  PLANNED INTERVENTIONS: Therapeutic exercises, Therapeutic activity, Neuromuscular re-education, Patient/Family education, Self Care, Joint mobilization, Aquatic Therapy, Dry Needling, Electrical stimulation, Spinal mobilization, Cryotherapy, Moist heat, Taping, Traction, Ultrasound, Manual therapy, and Re-evaluation  PLAN FOR NEXT SESSION: Review HEP, UBE, begin DN if patient agrees after reading information on DN   Haylei Cobin B. Jesscia Imm, PT 11/12/22 3:10 PM [facility] [address], [address] [place], [place] [address] Phone # [phone] Fax [phone]

## 2022-11-19 ENCOUNTER — Ambulatory Visit: Payer: BC Managed Care – PPO | Attending: Neurosurgery | Admitting: Physical Therapy

## 2022-11-19 ENCOUNTER — Encounter: Payer: Self-pay | Admitting: Physical Therapy

## 2022-11-19 DIAGNOSIS — M6281 Muscle weakness (generalized): Secondary | ICD-10-CM | POA: Diagnosis not present

## 2022-11-19 DIAGNOSIS — R252 Cramp and spasm: Secondary | ICD-10-CM

## 2022-11-19 DIAGNOSIS — R293 Abnormal posture: Secondary | ICD-10-CM | POA: Insufficient documentation

## 2022-11-19 DIAGNOSIS — M542 Cervicalgia: Secondary | ICD-10-CM | POA: Diagnosis not present

## 2022-11-19 NOTE — Patient Instructions (Signed)

## 2022-11-19 NOTE — Therapy (Signed)
OUTPATIENT PHYSICAL THERAPY CERVICAL PROGRESS NOTE   Patient Name: Krystal Mercado MRN: [id number] DOB:[birth year], 52 y.o., female Today's Date: 11/19/2022  END OF SESSION:  PT End of Session - 11/19/22 1217     Visit Number 2    Date for PT Re-Evaluation 01/07/23    Authorization Type [employer]    Progress Note Due on Visit 10    PT Start Time 1228    PT Stop Time 1310    PT Time Calculation (min) 42 min    Activity Tolerance Patient tolerated treatment well             Past Medical History:  Diagnosis Date   Allergy 2003   allergic rhinitis   Anxiety    Barrett's esophagus    Blood clot in vein    in thumb, had part of thumb amputated, no follow up   GERD (gastroesophageal reflux disease)    Observation or evaluation for suspected condition 04/28/2007   heme Evaluation no known pathology (Dr.Metjian, [facility])   Raynaud's disease    Skin necrosis (HCC)    left thumb- vascular eval at [facility]   Past Surgical History:  Procedure Laterality Date   ABDOMINAL HYSTERECTOMY     CHOLECYSTECTOMY  1995   conization, BTL   COLONOSCOPY N/A 05/20/2021   Procedure: COLONOSCOPYn WITH BIOPSY;  Surgeon: Midge Minium, MD;  Location: [facility];  Service: Endoscopy;  Laterality: N/A;   COLONOSCOPY WITH PROPOFOL N/A 08/13/2017   Procedure: COLONOSCOPY WITH PROPOFOL;  Surgeon: Midge Minium, MD;  Location: [facility];  Service: Endoscopy;  Laterality: N/A;   CYSTECTOMY  1990   cyst in fallopian tube removed, Tube intact   ESOPHAGOGASTRODUODENOSCOPY N/A 05/20/2021   Procedure: ESOPHAGOGASTRODUODENOSCOPY (EGD) WITH BIOPSY;  Surgeon: Midge Minium, MD;  Location: [facility];  Service: Endoscopy;  Laterality: N/A;   ESOPHAGOGASTRODUODENOSCOPY (EGD) WITH PROPOFOL N/A 08/13/2017   Procedure: ESOPHAGOGASTRODUODENOSCOPY (EGD) WITH PROPOFOL;  Surgeon: Midge Minium, MD;  Location: [facility];  Service: Endoscopy;  Laterality: N/A;   GROIN DISSECTION  05/2000    Right groin Lymph node dissection for cat scratch Disease   MRI  07/2004   negative except sinusitis   POLYPECTOMY N/A 08/13/2017   Procedure: POLYPECTOMY;  Surgeon: Midge Minium, MD;  Location: [facility];  Service: Endoscopy;  Laterality: N/A;   POLYPECTOMY N/A 05/20/2021   Procedure: POLYPECTOMY;  Surgeon: Midge Minium, MD;  Location: [facility];  Service: Endoscopy;  Laterality: N/A;   Thoracic aortogram  09/23/2006   Thoracic aortogram with select LUE arteriogram small radial artery below the brach bifurcation   TUBAL LIGATION  1995   conization, BTL; choleycystectomy   VAGINAL DELIVERY     X's 2   Patient Active Problem List   Diagnosis Date Noted   Low back pain 05/28/2022   Contact with or exposure to mold 01/22/2022   Left hip pain 01/22/2022   Tachycardia 09/01/2021   Personal history of colonic polyps    Polyp of sigmoid colon    Acute non-recurrent sinusitis 04/22/2021   Bursitis 12/26/2020   Routine general medical examination at a health care facility 11/13/2019   Advance care planning 11/13/2019   Hyperlipidemia 11/13/2019   Complex cyst of right ovary 10/09/2017   Benign neoplasm of transverse colon    Barrett's esophagus    SUI (stress urinary incontinence, female) 05/31/2017   Ocular migraine 05/31/2017   Radicular pain in left arm 05/31/2017   SKIN LESIONS, MULTIPLE 08/31/2007  RAYNAUDS SYNDROME 07/26/2007   VERTIGO 03/25/2007   Anxiety state 11/20/2006   TOBACCO ABUSE 09/22/2006   ALLERGIC RHINITIS 09/21/2006   ARTHRITIS 09/21/2006   HX, PERSONAL, TOBACCO USE 09/21/2006   HEADACHE, CHRONIC, HX OF 09/21/2006    PCP: Crawford Givens, MD  REFERRING PROVIDER: Lisbeth Renshaw, MD  REFERRING DIAG: [phone] (ICD-10-CM) - Spondylosis without myelopathy or radiculopathy, cervical region  THERAPY DIAG:  Cervicalgia  Cramp and spasm  Muscle weakness (generalized)  Rationale for Evaluation and Treatment: Rehabilitation  ONSET DATE:  11/11/2022  SUBJECTIVE:                                                                                                                                                                                                         SUBJECTIVE STATEMENT: Neck left worse than right;  "Crunchy".  4th and 5th fingers weakness intermittently particularly at night. I've done the ex's but not the band ex's b/c they are painting my doors at home (pt remembers she has a stair railing that could be used to anchor).  Headaches with some flashing in the eyes.      EVAL; Pain in neck for a while.  I was diagnosed with RA several months ago.   I have pain in all of my joints but my doctor did not think the pain in my neck was related to the RA.  She also c/o numbness in bilateral ulnar nerve distribution.  She works as a Transport planner.  She admits she has a lot of stress in life currently with her health issues and her sons new baby had to be in the NICU and soon after, the son had blood clots in both lungs.  She has frequent headaches as well and is a smoker.    Hand dominance: Right  PERTINENT HISTORY:  See above: multiple medical issues  PAIN:  Are you having pain? Yes: NPRS scale: 0/10 Pain location: neck  Pain description: increases to 4-5/10 at times, nagging gnawing Aggravating factors: sitting up Relieving factors: showers, ice packs, biofreeze, lidocaine, anti inflammtory  PRECAUTIONS: None  RED FLAGS: None     WEIGHT BEARING RESTRICTIONS: No  FALLS:  Has patient fallen in last 6 months? 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)  PLOF: Independent, Independent with basic ADLs, Independent with household mobility without device, Independent with community mobility without device, Independent with homemaking with ambulation, Independent with gait, and Independent with transfers  PATIENT  GOALS: to reduce pain  to manageable  NEXT MD VISIT: na  OBJECTIVE:   DIAGNOSTIC FINDINGS:  No recent imaging of the cervical spine  PATIENT SURVEYS:  FOTO 41, predicted 51 score based on patient feedback on current level of activity  COGNITION: Overall cognitive status: Within functional limits for tasks assessed  SENSATION: WFL  POSTURE:  elevated shoulders  PALPATION: Taut bands and trigger point bilateral upper traps and parascapular areas.     CERVICAL ROM:   Active ROM A/PROM (deg) eval  Flexion 45  Extension 50  Right lateral flexion 30  Left lateral flexion 32  Right rotation 45  Left rotation 40   (Blank rows = not tested)  UPPER EXTREMITY ROM:  WNL  UPPER EXTREMITY MMT: Shoulders generally 3+ to 4-/5,  elbow, forearm and wrist generally 4/5,    CERVICAL SPECIAL TESTS:  Spurling's test: Negative   TODAY'S TREATMENT:                                                                                                                              DATE:  9/4: Manual therapy: soft tissue mobilization to cervical paraspinals, bil upper traps, bil levator scap and bil suboccipitals  Review of initial HEP Left levator scap stretch with 30 sec hold 3x Green band rows 10x Green band bil shoulder extension 10x Trigger Point Dry-Needling  Treatment instructions: Expect mild to moderate muscle soreness. S/S of pneumothorax if dry needled over a lung field, and to seek immediate medical attention should they occur. Patient verbalized understanding of these instructions and education.  Patient Consent Given: Yes Education handout provided: Yes Muscles treated: bil cervical multifidi, bil upper trap; left levator scap; bil subocciptals Electrical stimulation performed: No Parameters: N/A Treatment response/outcome: much improved soft tissue mobility and dec tender point size and number  Heat following 2 min   PATIENT EDUCATION:  Education details: Initiated  HEP Person educated: Patient Education method: Programmer, multimedia, Facilities manager, Verbal cues, and Handouts Education comprehension: verbalized understanding, returned demonstration, and verbal cues required  HOME EXERCISE PROGRAM: Access Code: [email or web address] URL: [email or web address] Date: 11/19/2022 Prepared by: Lavinia Sharps  Exercises - Scapular Retraction with Resistance Advanced  - 2 x daily - 7 x weekly - 2 sets - 10 reps - Scapular Retraction with Resistance  - 2 x daily - 7 x weekly - 2 sets - 10 reps - Shoulder External Rotation and Scapular Retraction with Resistance  - 2 x daily - 7 x weekly - 2 sets - 10 reps - Standing Shoulder Horizontal Abduction with Resistance  - 1 x daily - 7 x weekly - 2 sets - 10 reps - Seated Passive Cervical Retraction  - 1 x daily - 7 x weekly - 3 sets - 10 reps - Seated Cervical Rotation AROM  - 1 x daily - 7 x weekly - 3 sets - 10 reps - Seated Cervical Sidebending AROM  - 1 x daily - 7 x weekly - 3 sets - 10  reps - Seated Cervical Sidebending Stretch  - 1 x daily - 7 x weekly - 3 sets - 10 reps - Seated Levator Scapulae Stretch  - 1 x daily - 7 x weekly - 1 sets - 10 reps  ASSESSMENT:  CLINICAL IMPRESSION: Good initial response to DN.  The patient had numerous muscle twitches produced during dry needling (particularly in left levator scap muscle) which is a good prognostic indicator for benefit. The patient was encouraged in regular performance of HEP post DN including soft tissue lengthening and strengthening exercises to enhance long term benefit.     OBJECTIVE IMPAIRMENTS: cardiopulmonary status limiting activity, decreased activity tolerance, decreased endurance, decreased ROM, decreased strength, increased fascial restrictions, increased muscle spasms, impaired sensation, impaired UE functional use, postural dysfunction, and pain.   ACTIVITY LIMITATIONS: carrying, lifting, standing, sleeping, transfers, bed mobility, bathing,  dressing, reach over head, and hygiene/grooming  PARTICIPATION LIMITATIONS: meal prep, cleaning, laundry, driving, shopping, community activity, occupation, and yard work  PERSONAL FACTORS: Behavior pattern, Fitness, Past/current experiences, Profession, and 3+ comorbidities: see multiple medical issues above, these include anxiety, depression, smoking, RA  are also affecting patient's functional outcome.   REHAB POTENTIAL: Fair due to chronic pain history  CLINICAL DECISION MAKING: Evolving/moderate complexity  EVALUATION COMPLEXITY: Moderate   GOALS: Goals reviewed with patient? Yes  SHORT TERM GOALS: Target date: 12/10/2022   Pain report to be no greater than 4/10  Baseline:  Goal status: INITIAL  2.  Patient will be independent with initial HEP  Baseline:  Goal status: INITIAL   LONG TERM GOALS: Target date: 01/07/2023   Patient to report pain no greater than 2/10  Baseline:  Goal status: INITIAL  2.  Patient to be independent with advanced HEP  Baseline:  Goal status: INITIAL  3.  C spine ROM to improve by 3-5 degrees on rotation and side bending Baseline:  Goal status: INITIAL  4.  Patient to report 85% improvement in overall symptoms Baseline:  Goal status: INITIAL  5.  UE strength to improve to 4/5 bilateral shoulders and 4+/5 bilateral elbow, wrist and hand Baseline:  Goal status: INITIAL  6.  Patient to be able to sleep through the night  Baseline:  Goal status: INITIAL   PLAN:  PT FREQUENCY: 1-2x/week  PT DURATION: 8 weeks  PLANNED INTERVENTIONS: Therapeutic exercises, Therapeutic activity, Neuromuscular re-education, Patient/Family education, Self Care, Joint mobilization, Aquatic Therapy, Dry Needling, Electrical stimulation, Spinal mobilization, Cryotherapy, Moist heat, Taping, Traction, Ultrasound, Manual therapy, and Re-evaluation  PLAN FOR NEXT SESSION: Review and progress HEP, UBE,assess response to DN #1; add SNAG;  going to  [place]glamping" 9/19  Lavinia Sharps, PT 11/19/22 4:19 PM Phone: [phone] Fax: [phone]  [facility] [address] [place], [place] [address] Phone # [phone] Fax [phone]

## 2022-11-23 NOTE — Therapy (Signed)
OUTPATIENT PHYSICAL THERAPY CERVICAL PROGRESS NOTE   Patient Name: Krystal Mercado MRN: [id number] DOB:1970/06/10, 52 y.o., female Today's Date: 11/24/2022  END OF SESSION:  PT End of Session - 11/24/22 1620     Visit Number 3    Date for PT Re-Evaluation 01/07/23    Authorization Type [employer]    Progress Note Due on Visit 10    PT Start Time 1621    PT Stop Time 1704    PT Time Calculation (min) 43 min    Activity Tolerance Patient tolerated treatment well    Behavior During Therapy [facility] for tasks assessed/performed              Past Medical History:  Diagnosis Date   Allergy 2003   allergic rhinitis   Anxiety    Barrett's esophagus    Blood clot in vein    in thumb, had part of thumb amputated, no follow up   GERD (gastroesophageal reflux disease)    Observation or evaluation for suspected condition 04/28/2007   heme Evaluation no known pathology (Dr.Metjian, [facility])   Raynaud's disease    Skin necrosis (HCC)    left thumb- vascular eval at [facility]   Past Surgical History:  Procedure Laterality Date   ABDOMINAL HYSTERECTOMY     CHOLECYSTECTOMY  1995   conization, BTL   COLONOSCOPY N/A 05/20/2021   Procedure: COLONOSCOPYn WITH BIOPSY;  Surgeon: Midge Minium, MD;  Location: [facility];  Service: Endoscopy;  Laterality: N/A;   COLONOSCOPY WITH PROPOFOL N/A 08/13/2017   Procedure: COLONOSCOPY WITH PROPOFOL;  Surgeon: Midge Minium, MD;  Location: [facility];  Service: Endoscopy;  Laterality: N/A;   CYSTECTOMY  1990   cyst in fallopian tube removed, Tube intact   ESOPHAGOGASTRODUODENOSCOPY N/A 05/20/2021   Procedure: ESOPHAGOGASTRODUODENOSCOPY (EGD) WITH BIOPSY;  Surgeon: Midge Minium, MD;  Location: [facility];  Service: Endoscopy;  Laterality: N/A;   ESOPHAGOGASTRODUODENOSCOPY (EGD) WITH PROPOFOL N/A 08/13/2017   Procedure: ESOPHAGOGASTRODUODENOSCOPY (EGD) WITH PROPOFOL;  Surgeon: Midge Minium, MD;  Location: [facility];  Service:  Endoscopy;  Laterality: N/A;   GROIN DISSECTION  05/2000   Right groin Lymph node dissection for cat scratch Disease   MRI  07/2004   negative except sinusitis   POLYPECTOMY N/A 08/13/2017   Procedure: POLYPECTOMY;  Surgeon: Midge Minium, MD;  Location: [facility];  Service: Endoscopy;  Laterality: N/A;   POLYPECTOMY N/A 05/20/2021   Procedure: POLYPECTOMY;  Surgeon: Midge Minium, MD;  Location: [facility];  Service: Endoscopy;  Laterality: N/A;   Thoracic aortogram  09/23/2006   Thoracic aortogram with select LUE arteriogram small radial artery below the brach bifurcation   TUBAL LIGATION  1995   conization, BTL; choleycystectomy   VAGINAL DELIVERY     X's 2   Patient Active Problem List   Diagnosis Date Noted   Low back pain 05/28/2022   Contact with or exposure to mold 01/22/2022   Left hip pain 01/22/2022   Tachycardia 09/01/2021   Personal history of colonic polyps    Polyp of sigmoid colon    Acute non-recurrent sinusitis 04/22/2021   Bursitis 12/26/2020   Routine general medical examination at a health care facility 11/13/2019   Advance care planning 11/13/2019   Hyperlipidemia 11/13/2019   Complex cyst of right ovary 10/09/2017   Benign neoplasm of transverse colon    Barrett's esophagus    SUI (stress urinary incontinence, female) 05/31/2017   Ocular migraine 05/31/2017   Radicular pain  in left arm 05/31/2017   SKIN LESIONS, MULTIPLE 08/31/2007   RAYNAUDS SYNDROME 07/26/2007   VERTIGO 03/25/2007   Anxiety state 11/20/2006   TOBACCO ABUSE 09/22/2006   ALLERGIC RHINITIS 09/21/2006   ARTHRITIS 09/21/2006   HX, PERSONAL, TOBACCO USE 09/21/2006   HEADACHE, CHRONIC, HX OF 09/21/2006    PCP: Crawford Givens, MD  REFERRING PROVIDER: Lisbeth Renshaw, MD  REFERRING DIAG: [phone] (ICD-10-CM) - Spondylosis without myelopathy or radiculopathy, cervical region  THERAPY DIAG:  Cervicalgia  Cramp and spasm  Muscle weakness (generalized)  Abnormal  posture  Rationale for Evaluation and Treatment: Rehabilitation  ONSET DATE: 11/11/2022  SUBJECTIVE:                                                                                                                                                                                                         SUBJECTIVE STATEMENT: Patient had very good results from DN. It helped a lot. No pain today. Still has some tingling in digits 4 and 5 B.    EVAL; Pain in neck for a while.  I was diagnosed with RA several months ago.   I have pain in all of my joints but my doctor did not think the pain in my neck was related to the RA.  She also c/o numbness in bilateral ulnar nerve distribution.  She works as a Transport planner.  She admits she has a lot of stress in life currently with her health issues and her sons new baby had to be in the NICU and soon after, the son had blood clots in both lungs.  She has frequent headaches as well and is a smoker.    Hand dominance: Right  PERTINENT HISTORY:  See above: multiple medical issues  PAIN:  Are you having pain? Yes: NPRS scale: 0/10 Pain location: neck  Pain description: increases to 4-5/10 at times, nagging gnawing Aggravating factors: sitting up Relieving factors: showers, ice packs, biofreeze, lidocaine, anti inflammtory  PRECAUTIONS: None  RED FLAGS: None     WEIGHT BEARING RESTRICTIONS: No  FALLS:  Has patient fallen in last 6 months? 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)  PLOF: Independent, Independent with basic ADLs, Independent with household mobility without device, Independent with community mobility without device, Independent with homemaking with ambulation, Independent with gait, and Independent with transfers  PATIENT GOALS: to reduce pain to manageable  NEXT MD VISIT: na  OBJECTIVE:   DIAGNOSTIC FINDINGS:  No recent  imaging of  the cervical spine  PATIENT SURVEYS:  FOTO 41, predicted 51 score based on patient feedback on current level of activity  COGNITION: Overall cognitive status: Within functional limits for tasks assessed  SENSATION: WFL  POSTURE:  elevated shoulders  PALPATION: Taut bands and trigger point bilateral upper traps and parascapular areas.     CERVICAL ROM:   Active ROM A/PROM (deg) eval  Flexion 45  Extension 50  Right lateral flexion 30  Left lateral flexion 32  Right rotation 45  Left rotation 40   (Blank rows = not tested)  UPPER EXTREMITY ROM:  WNL  UPPER EXTREMITY MMT: Shoulders generally 3+ to 4-/5,  elbow, forearm and wrist generally 4/5,    CERVICAL SPECIAL TESTS:  Spurling's test: Negative   TODAY'S TREATMENT:                                                                                                                              DATE:   11/24/22 UBE x 4 min 2 fwd/2bwd ER with scap retraction red Tband 2x10 Horizontal ABD Red Tband 2x10 Rows green x20 Ext green x20 Ulnar nerve glide in prayer position x 5 5 sec hold B Prone on elbows neck diagonals x 10 ea  Manual: Seated manual traction with rotation x 5 ea; Skilled palpation and monitoring of soft tissues during DN. STM to B UT and cspine. CPA and UPA mobs to cspine, also rotational mobs and lateral glides. PROM in to Rotation. Trigger Point Dry-Needling  Treatment instructions: Expect mild to moderate muscle soreness. S/S of pneumothorax if dry needled over a lung field, and to seek immediate medical attention should they occur. Patient verbalized understanding of these instructions and education. Patient Consent Given: Yes Education handout provided: Previously provided Muscles treated: B cervical multifidi and UT Electrical stimulation performed: No Parameters: N/A Treatment response/outcome: Twitch Response Elicited and Palpable Increase in Muscle Length   9/4: Manual therapy: soft  tissue mobilization to cervical paraspinals, bil upper traps, bil levator scap and bil suboccipitals  Review of initial HEP Left levator scap stretch with 30 sec hold 3x Green band rows 10x Green band bil shoulder extension 10x Trigger Point Dry-Needling  Treatment instructions: Expect mild to moderate muscle soreness. S/S of pneumothorax if dry needled over a lung field, and to seek immediate medical attention should they occur. Patient verbalized understanding of these instructions and education.  Patient Consent Given: Yes Education handout provided: Yes Muscles treated: bil cervical multifidi, bil upper trap; left levator scap; bil subocciptals Electrical stimulation performed: No Parameters: N/A Treatment response/outcome: much improved soft tissue mobility and dec tender point size and number  Heat following 2 min   PATIENT EDUCATION:  Education details: Initiated HEP Person educated: Patient Education method: Programmer, multimedia, Facilities manager, Verbal cues, and Handouts Education comprehension: verbalized understanding, returned demonstration, and verbal cues required  HOME EXERCISE PROGRAM: Access Code: [email or web address] URL: [email or web address] Date: 11/19/2022 Prepared by: Lavinia Sharps  Exercises - Scapular Retraction  with Resistance Advanced  - 2 x daily - 7 x weekly - 2 sets - 10 reps - Scapular Retraction with Resistance  - 2 x daily - 7 x weekly - 2 sets - 10 reps - Shoulder External Rotation and Scapular Retraction with Resistance  - 2 x daily - 7 x weekly - 2 sets - 10 reps - Standing Shoulder Horizontal Abduction with Resistance  - 1 x daily - 7 x weekly - 2 sets - 10 reps - Seated Passive Cervical Retraction  - 1 x daily - 7 x weekly - 3 sets - 10 reps - Seated Cervical Rotation AROM  - 1 x daily - 7 x weekly - 3 sets - 10 reps - Seated Cervical Sidebending AROM  - 1 x daily - 7 x weekly - 3 sets - 10 reps - Seated Cervical Sidebending Stretch  - 1 x daily - 7 x  weekly - 3 sets - 10 reps - Seated Levator Scapulae Stretch  - 1 x daily - 7 x weekly - 1 sets - 10 reps  ASSESSMENT:  CLINICAL IMPRESSION: Eyvette reports significant relief after DN last session. She still demonstrates decreased left neck rotation and had poor quality of movement prior to manual therapy and DN. She had full passive rotation B in supine post manual and improved left rotation in sitting as well (not full). FMLA form #6 was completed and PT schedule attached per patient request. Decreased ulnar neural tension today but still present on left side. Tolerated ulnar nerve flossing in prayer position well. Aissa continues to demonstrate potential for improvement and would benefit from continued skilled therapy to address impairments.     OBJECTIVE IMPAIRMENTS: cardiopulmonary status limiting activity, decreased activity tolerance, decreased endurance, decreased ROM, decreased strength, increased fascial restrictions, increased muscle spasms, impaired sensation, impaired UE functional use, postural dysfunction, and pain.   ACTIVITY LIMITATIONS: carrying, lifting, standing, sleeping, transfers, bed mobility, bathing, dressing, reach over head, and hygiene/grooming  PARTICIPATION LIMITATIONS: meal prep, cleaning, laundry, driving, shopping, community activity, occupation, and yard work  PERSONAL FACTORS: Behavior pattern, Fitness, Past/current experiences, Profession, and 3+ comorbidities: see multiple medical issues above, these include anxiety, depression, smoking, RA  are also affecting patient's functional outcome.   REHAB POTENTIAL: Fair due to chronic pain history  CLINICAL DECISION MAKING: Evolving/moderate complexity  EVALUATION COMPLEXITY: Moderate   GOALS: Goals reviewed with patient? Yes  SHORT TERM GOALS: Target date: 12/10/2022   Pain report to be no greater than 4/10  Baseline:  Goal status: INITIAL  2.  Patient will be independent with initial HEP  Baseline:   Goal status: INITIAL   LONG TERM GOALS: Target date: 01/07/2023   Patient to report pain no greater than 2/10  Baseline:  Goal status: INITIAL  2.  Patient to be independent with advanced HEP  Baseline:  Goal status: INITIAL  3.  C spine ROM to improve by 3-5 degrees on rotation and side bending Baseline:  Goal status: INITIAL  4.  Patient to report 85% improvement in overall symptoms Baseline:  Goal status: INITIAL  5.  UE strength to improve to 4/5 bilateral shoulders and 4+/5 bilateral elbow, wrist and hand Baseline:  Goal status: INITIAL  6.  Patient to be able to sleep through the night  Baseline:  Goal status: INITIAL   PLAN:  PT FREQUENCY: 1-2x/week  PT DURATION: 8 weeks  PLANNED INTERVENTIONS: Therapeutic exercises, Therapeutic activity, Neuromuscular re-education, Patient/Family education, Self Care, Joint mobilization, Aquatic Therapy, Dry Needling,  Electrical stimulation, Spinal mobilization, Cryotherapy, Moist heat, Taping, Traction, Ultrasound, Manual therapy, and Re-evaluation  PLAN FOR NEXT SESSION: Print nerve flossing for pt from HEP, UBE,assess response to DN #2; add SNAG;  going to [place] "glamping" 9/19  Solon Palm, PT 11/24/22 5:13 PM Phone: [phone] Fax: [phone]  [facility] [address] [place], [place] [address] Phone # [phone] Fax [phone]

## 2022-11-24 ENCOUNTER — Ambulatory Visit: Payer: BC Managed Care – PPO | Admitting: Physical Therapy

## 2022-11-24 ENCOUNTER — Encounter: Payer: Self-pay | Admitting: Physical Therapy

## 2022-11-24 DIAGNOSIS — M6281 Muscle weakness (generalized): Secondary | ICD-10-CM | POA: Diagnosis not present

## 2022-11-24 DIAGNOSIS — R293 Abnormal posture: Secondary | ICD-10-CM

## 2022-11-24 DIAGNOSIS — M542 Cervicalgia: Secondary | ICD-10-CM | POA: Diagnosis not present

## 2022-11-24 DIAGNOSIS — R252 Cramp and spasm: Secondary | ICD-10-CM | POA: Diagnosis not present

## 2022-11-25 DIAGNOSIS — M79641 Pain in right hand: Secondary | ICD-10-CM | POA: Diagnosis not present

## 2022-11-25 DIAGNOSIS — M2540 Effusion, unspecified joint: Secondary | ICD-10-CM | POA: Diagnosis not present

## 2022-11-25 DIAGNOSIS — M0609 Rheumatoid arthritis without rheumatoid factor, multiple sites: Secondary | ICD-10-CM | POA: Diagnosis not present

## 2022-11-25 DIAGNOSIS — M542 Cervicalgia: Secondary | ICD-10-CM | POA: Diagnosis not present

## 2022-11-27 NOTE — Therapy (Signed)
OUTPATIENT PHYSICAL THERAPY CERVICAL PROGRESS NOTE   Patient Name: DESHONE SHERBA MRN: [id number] DOB:[birth year], 52 y.o., female Today's Date: 11/28/2022  END OF SESSION:  PT End of Session - 11/28/22 1100     Visit Number 4    Date for PT Re-Evaluation 01/07/23    Authorization Type [employer]    Progress Note Due on Visit 10    PT Start Time 1100    PT Stop Time 1143    PT Time Calculation (min) 43 min    Activity Tolerance Patient tolerated treatment well    Behavior During Therapy [facility] for tasks assessed/performed               Past Medical History:  Diagnosis Date   Allergy 2003   allergic rhinitis   Anxiety    Barrett's esophagus    Blood clot in vein    in thumb, had part of thumb amputated, no follow up   GERD (gastroesophageal reflux disease)    Observation or evaluation for suspected condition 04/28/2007   heme Evaluation no known pathology (Dr.Metjian, [facility])   Raynaud's disease    Skin necrosis (HCC)    left thumb- vascular eval at [facility]   Past Surgical History:  Procedure Laterality Date   ABDOMINAL HYSTERECTOMY     CHOLECYSTECTOMY  1995   conization, BTL   COLONOSCOPY N/A 05/20/2021   Procedure: COLONOSCOPYn WITH BIOPSY;  Surgeon: Midge Minium, MD;  Location: [facility];  Service: Endoscopy;  Laterality: N/A;   COLONOSCOPY WITH PROPOFOL N/A 08/13/2017   Procedure: COLONOSCOPY WITH PROPOFOL;  Surgeon: Midge Minium, MD;  Location: [facility];  Service: Endoscopy;  Laterality: N/A;   CYSTECTOMY  1990   cyst in fallopian tube removed, Tube intact   ESOPHAGOGASTRODUODENOSCOPY N/A 05/20/2021   Procedure: ESOPHAGOGASTRODUODENOSCOPY (EGD) WITH BIOPSY;  Surgeon: Midge Minium, MD;  Location: [facility];  Service: Endoscopy;  Laterality: N/A;   ESOPHAGOGASTRODUODENOSCOPY (EGD) WITH PROPOFOL N/A 08/13/2017   Procedure: ESOPHAGOGASTRODUODENOSCOPY (EGD) WITH PROPOFOL;  Surgeon: Midge Minium, MD;  Location: [facility];  Service:  Endoscopy;  Laterality: N/A;   GROIN DISSECTION  05/2000   Right groin Lymph node dissection for cat scratch Disease   MRI  07/2004   negative except sinusitis   POLYPECTOMY N/A 08/13/2017   Procedure: POLYPECTOMY;  Surgeon: Midge Minium, MD;  Location: [facility];  Service: Endoscopy;  Laterality: N/A;   POLYPECTOMY N/A 05/20/2021   Procedure: POLYPECTOMY;  Surgeon: Midge Minium, MD;  Location: [facility];  Service: Endoscopy;  Laterality: N/A;   Thoracic aortogram  09/23/2006   Thoracic aortogram with select LUE arteriogram small radial artery below the brach bifurcation   TUBAL LIGATION  1995   conization, BTL; choleycystectomy   VAGINAL DELIVERY     X's 2   Patient Active Problem List   Diagnosis Date Noted   Low back pain 05/28/2022   Contact with or exposure to mold 01/22/2022   Left hip pain 01/22/2022   Tachycardia 09/01/2021   Personal history of colonic polyps    Polyp of sigmoid colon    Acute non-recurrent sinusitis 04/22/2021   Bursitis 12/26/2020   Routine general medical examination at a health care facility 11/13/2019   Advance care planning 11/13/2019   Hyperlipidemia 11/13/2019   Complex cyst of right ovary 10/09/2017   Benign neoplasm of transverse colon    Barrett's esophagus    SUI (stress urinary incontinence, female) 05/31/2017   Ocular migraine 05/31/2017   Radicular  pain in left arm 05/31/2017   SKIN LESIONS, MULTIPLE 08/31/2007   RAYNAUDS SYNDROME 07/26/2007   VERTIGO 03/25/2007   Anxiety state 11/20/2006   TOBACCO ABUSE 09/22/2006   ALLERGIC RHINITIS 09/21/2006   ARTHRITIS 09/21/2006   HX, PERSONAL, TOBACCO USE 09/21/2006   HEADACHE, CHRONIC, HX OF 09/21/2006    PCP: Crawford Givens, MD  REFERRING PROVIDER: Lisbeth Renshaw, MD  REFERRING DIAG: [phone] (ICD-10-CM) - Spondylosis without myelopathy or radiculopathy, cervical region  THERAPY DIAG:  Cervicalgia  Cramp and spasm  Muscle weakness (generalized)  Abnormal  posture  Rationale for Evaluation and Treatment: Rehabilitation  ONSET DATE: 11/11/2022  SUBJECTIVE:                                                                                                                                                                                                         SUBJECTIVE STATEMENT: Everything is about the same. Still have glitch on the left side with turning.    EVAL; Pain in neck for a while.  I was diagnosed with RA several months ago.   I have pain in all of my joints but my doctor did not think the pain in my neck was related to the RA.  She also c/o numbness in bilateral ulnar nerve distribution.  She works as a Transport planner.  She admits she has a lot of stress in life currently with her health issues and her sons new baby had to be in the NICU and soon after, the son had blood clots in both lungs.  She has frequent headaches as well and is a smoker.    Hand dominance: Right  PERTINENT HISTORY:  See above: multiple medical issues  PAIN:  Are you having pain? Yes: NPRS scale: 0/10 Pain location: neck  Pain description: increases to 4-5/10 at times, nagging gnawing Aggravating factors: sitting up Relieving factors: showers, ice packs, biofreeze, lidocaine, anti inflammtory  PRECAUTIONS: None  RED FLAGS: None     WEIGHT BEARING RESTRICTIONS: No  FALLS:  Has patient fallen in last 6 months? 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)  PLOF: Independent, Independent with basic ADLs, Independent with household mobility without device, Independent with community mobility without device, Independent with homemaking with ambulation, Independent with gait, and Independent with transfers  PATIENT GOALS: to reduce pain to manageable  NEXT MD VISIT: na  OBJECTIVE:   DIAGNOSTIC FINDINGS:  No recent imaging of the cervical  spine  PATIENT SURVEYS:  FOTO 41,  predicted 51 score based on patient feedback on current level of activity  COGNITION: Overall cognitive status: Within functional limits for tasks assessed  SENSATION: WFL  POSTURE:  elevated shoulders  PALPATION: Taut bands and trigger point bilateral upper traps and parascapular areas.     CERVICAL ROM:   Active ROM A/PROM (deg) eval  Flexion 45  Extension 50  Right lateral flexion 30  Left lateral flexion 32  Right rotation 45  Left rotation 40   (Blank rows = not tested)  UPPER EXTREMITY ROM:  WNL  UPPER EXTREMITY MMT: Shoulders generally 3+ to 4-/5,  elbow, forearm and wrist generally 4/5,    CERVICAL SPECIAL TESTS:  Spurling's test: Negative   TODAY'S TREATMENT:                                                                                                                              DATE:   11/28/22 UBE x 4 min 2 fwd/2bwd ER with scap retraction red Tband 2x10 Horizontal ABD green Tband 2x10 Rows blue x20 Ext green x20 Ulnar nerve glide in prayer position x 5 5 sec hold B Seated self traction  Manual: Seated manual traction with rotation x 5 L; SNAGS  to left Cspine into rotation and  flex/ext;  Mechanical cervical traction: 13# static x 10 min   11/24/22 UBE x 4 min 2 fwd/2bwd ER with scap retraction red Tband 2x10 Horizontal ABD Red Tband 2x10 Rows green x20 Ext green x20 Ulnar nerve glide in prayer position x 5 5 sec hold B Prone on elbows neck diagonals x 10 ea  Manual: Seated manual traction with rotation x 5 ea; Skilled palpation and monitoring of soft tissues during DN. STM to B UT and cspine. CPA and UPA mobs to cspine, also rotational mobs and lateral glides. PROM in to Rotation. Trigger Point Dry-Needling  Treatment instructions: Expect mild to moderate muscle soreness. S/S of pneumothorax if dry needled over a lung field, and to seek immediate medical attention should they occur. Patient verbalized  understanding of these instructions and education. Patient Consent Given: Yes Education handout provided: Previously provided Muscles treated: B cervical multifidi and UT Electrical stimulation performed: No Parameters: N/A Treatment response/outcome: Twitch Response Elicited and Palpable Increase in Muscle Length   9/4: Manual therapy: soft tissue mobilization to cervical paraspinals, bil upper traps, bil levator scap and bil suboccipitals  Review of initial HEP Left levator scap stretch with 30 sec hold 3x Green band rows 10x Green band bil shoulder extension 10x Trigger Point Dry-Needling  Treatment instructions: Expect mild to moderate muscle soreness. S/S of pneumothorax if dry needled over a lung field, and to seek immediate medical attention should they occur. Patient verbalized understanding of these instructions and education.  Patient Consent Given: Yes Education handout provided: Yes Muscles treated: bil cervical multifidi, bil upper trap; left levator scap; bil subocciptals Electrical stimulation performed: No Parameters: N/A Treatment response/outcome: much improved soft tissue mobility  and dec tender point size and number  Heat following 2 min   PATIENT EDUCATION:  Education details: HEP update Person educated: Patient Education method: Explanation, Demonstration, Verbal cues, and Handouts Education comprehension: verbalized understanding, returned demonstration, and verbal cues required  HOME EXERCISE PROGRAM: Access Code: [email or web address] URL: [email or web address] Date: 11/28/2022 Prepared by: Raynelle Fanning  Exercises - Scapular Retraction with Resistance Advanced  - 2 x daily - 7 x weekly - 2 sets - 10 reps - Scapular Retraction with Resistance  - 2 x daily - 7 x weekly - 2 sets - 10 reps - Shoulder External Rotation and Scapular Retraction with Resistance  - 2 x daily - 7 x weekly - 2 sets - 10 reps - Standing Shoulder Horizontal Abduction with Resistance  -  1 x daily - 7 x weekly - 2 sets - 10 reps - Seated Passive Cervical Retraction  - 1 x daily - 7 x weekly - 3 sets - 10 reps - Seated Cervical Rotation AROM  - 1 x daily - 7 x weekly - 3 sets - 10 reps - Seated Cervical Sidebending AROM  - 1 x daily - 7 x weekly - 3 sets - 10 reps - Seated Cervical Sidebending Stretch  - 1 x daily - 7 x weekly - 3 sets - 10 reps - Seated Levator Scapulae Stretch  - 1 x daily - 7 x weekly - 1 sets - 10 reps - Ulnar Nerve Flossing  - 1 x daily - 7 x weekly - 1 sets - 10 reps - 2-3 hold  ASSESSMENT:  CLINICAL IMPRESSION: Marvena reports no change in sx since last visit. She tolerated some increase in resistance with scapular exercises, however shoulder pain and fatigue limits her somewhat. She responded well to [facility] and very well to mechanical cervical traction while on it. Her left rotation is smoother after, but no significant change in motion. She continues to have B ulnar nerve tension. Exilda will be on vacation next week and return to PT upon return.   OBJECTIVE IMPAIRMENTS: cardiopulmonary status limiting activity, decreased activity tolerance, decreased endurance, decreased ROM, decreased strength, increased fascial restrictions, increased muscle spasms, impaired sensation, impaired UE functional use, postural dysfunction, and pain.   ACTIVITY LIMITATIONS: carrying, lifting, standing, sleeping, transfers, bed mobility, bathing, dressing, reach over head, and hygiene/grooming  PARTICIPATION LIMITATIONS: meal prep, cleaning, laundry, driving, shopping, community activity, occupation, and yard work  PERSONAL FACTORS: Behavior pattern, Fitness, Past/current experiences, Profession, and 3+ comorbidities: see multiple medical issues above, these include anxiety, depression, smoking, RA  are also affecting patient's functional outcome.   REHAB POTENTIAL: Fair due to chronic pain history  CLINICAL DECISION MAKING: Evolving/moderate complexity  EVALUATION  COMPLEXITY: Moderate   GOALS: Goals reviewed with patient? Yes  SHORT TERM GOALS: Target date: 12/10/2022   Pain report to be no greater than 4/10  Baseline:  Goal status: INITIAL  2.  Patient will be independent with initial HEP  Baseline:  Goal status: INITIAL   LONG TERM GOALS: Target date: 01/07/2023   Patient to report pain no greater than 2/10  Baseline:  Goal status: INITIAL  2.  Patient to be independent with advanced HEP  Baseline:  Goal status: INITIAL  3.  C spine ROM to improve by 3-5 degrees on rotation and side bending Baseline:  Goal status: INITIAL  4.  Patient to report 85% improvement in overall symptoms Baseline:  Goal status: INITIAL  5.  UE strength to improve to 4/5 bilateral shoulders and  4+/5 bilateral elbow, wrist and hand Baseline:  Goal status: INITIAL  6.  Patient to be able to sleep through the night  Baseline:  Goal status: INITIAL   PLAN:  PT FREQUENCY: 1-2x/week  PT DURATION: 8 weeks  PLANNED INTERVENTIONS: Therapeutic exercises, Therapeutic activity, Neuromuscular re-education, Patient/Family education, Self Care, Joint mobilization, Aquatic Therapy, Dry Needling, Electrical stimulation, Spinal mobilization, Cryotherapy, Moist heat, Taping, Traction, Ultrasound, Manual therapy, and Re-evaluation  PLAN FOR NEXT SESSION: UBE,assess response to traction and continue at greater pull if indicated; add SNAG;  going to [place] "glamping" 9/19  Solon Palm, PT 11/28/22 11:49 AM Phone: [phone] Fax: [phone]  [facility] [address], [address] [place], [place] [address] Phone # [phone] Fax [phone]

## 2022-11-28 ENCOUNTER — Encounter: Payer: Self-pay | Admitting: Physical Therapy

## 2022-11-28 ENCOUNTER — Ambulatory Visit: Payer: BC Managed Care – PPO | Admitting: Physical Therapy

## 2022-11-28 DIAGNOSIS — R293 Abnormal posture: Secondary | ICD-10-CM | POA: Diagnosis not present

## 2022-11-28 DIAGNOSIS — R252 Cramp and spasm: Secondary | ICD-10-CM

## 2022-11-28 DIAGNOSIS — M542 Cervicalgia: Secondary | ICD-10-CM | POA: Diagnosis not present

## 2022-11-28 DIAGNOSIS — M6281 Muscle weakness (generalized): Secondary | ICD-10-CM | POA: Diagnosis not present

## 2022-12-07 NOTE — Therapy (Signed)
OUTPATIENT PHYSICAL THERAPY CERVICAL PROGRESS NOTE   Patient Name: Krystal Mercado MRN: [id number] DOB:10/26/1970, 52 y.o., female Today's Date: 12/08/2022  END OF SESSION:  PT End of Session - 12/08/22 1358     Visit Number 5    Date for PT Re-Evaluation 01/07/23    Authorization Type [employer]    Progress Note Due on Visit 10    PT Start Time 1400    PT Stop Time 1442    PT Time Calculation (min) 42 min    Activity Tolerance Patient tolerated treatment well    Behavior During Therapy [facility] for tasks assessed/performed                Past Medical History:  Diagnosis Date   Allergy 2003   allergic rhinitis   Anxiety    Barrett's esophagus    Blood clot in vein    in thumb, had part of thumb amputated, no follow up   GERD (gastroesophageal reflux disease)    Observation or evaluation for suspected condition 04/28/2007   heme Evaluation no known pathology (Dr.Metjian, [facility])   Raynaud's disease    Skin necrosis (HCC)    left thumb- vascular eval at [facility]   Past Surgical History:  Procedure Laterality Date   ABDOMINAL HYSTERECTOMY     CHOLECYSTECTOMY  1995   conization, BTL   COLONOSCOPY N/A 05/20/2021   Procedure: COLONOSCOPYn WITH BIOPSY;  Surgeon: Midge Minium, MD;  Location: [facility];  Service: Endoscopy;  Laterality: N/A;   COLONOSCOPY WITH PROPOFOL N/A 08/13/2017   Procedure: COLONOSCOPY WITH PROPOFOL;  Surgeon: Midge Minium, MD;  Location: [facility];  Service: Endoscopy;  Laterality: N/A;   CYSTECTOMY  1990   cyst in fallopian tube removed, Tube intact   ESOPHAGOGASTRODUODENOSCOPY N/A 05/20/2021   Procedure: ESOPHAGOGASTRODUODENOSCOPY (EGD) WITH BIOPSY;  Surgeon: Midge Minium, MD;  Location: [facility];  Service: Endoscopy;  Laterality: N/A;   ESOPHAGOGASTRODUODENOSCOPY (EGD) WITH PROPOFOL N/A 08/13/2017   Procedure: ESOPHAGOGASTRODUODENOSCOPY (EGD) WITH PROPOFOL;  Surgeon: Midge Minium, MD;  Location: [facility];   Service: Endoscopy;  Laterality: N/A;   GROIN DISSECTION  05/2000   Right groin Lymph node dissection for cat scratch Disease   MRI  07/2004   negative except sinusitis   POLYPECTOMY N/A 08/13/2017   Procedure: POLYPECTOMY;  Surgeon: Midge Minium, MD;  Location: [facility];  Service: Endoscopy;  Laterality: N/A;   POLYPECTOMY N/A 05/20/2021   Procedure: POLYPECTOMY;  Surgeon: Midge Minium, MD;  Location: [facility];  Service: Endoscopy;  Laterality: N/A;   Thoracic aortogram  09/23/2006   Thoracic aortogram with select LUE arteriogram small radial artery below the brach bifurcation   TUBAL LIGATION  1995   conization, BTL; choleycystectomy   VAGINAL DELIVERY     X's 2   Patient Active Problem List   Diagnosis Date Noted   Low back pain 05/28/2022   Contact with or exposure to mold 01/22/2022   Left hip pain 01/22/2022   Tachycardia 09/01/2021   Personal history of colonic polyps    Polyp of sigmoid colon    Acute non-recurrent sinusitis 04/22/2021   Bursitis 12/26/2020   Routine general medical examination at a health care facility 11/13/2019   Advance care planning 11/13/2019   Hyperlipidemia 11/13/2019   Complex cyst of right ovary 10/09/2017   Benign neoplasm of transverse colon    Barrett's esophagus    SUI (stress urinary incontinence, female) 05/31/2017   Ocular migraine 05/31/2017  Radicular pain in left arm 05/31/2017   SKIN LESIONS, MULTIPLE 08/31/2007   RAYNAUDS SYNDROME 07/26/2007   VERTIGO 03/25/2007   Anxiety state 11/20/2006   TOBACCO ABUSE 09/22/2006   ALLERGIC RHINITIS 09/21/2006   ARTHRITIS 09/21/2006   HX, PERSONAL, TOBACCO USE 09/21/2006   HEADACHE, CHRONIC, HX OF 09/21/2006    PCP: Crawford Givens, MD  REFERRING PROVIDER: Lisbeth Renshaw, MD  REFERRING DIAG: [phone] (ICD-10-CM) - Spondylosis without myelopathy or radiculopathy, cervical region  THERAPY DIAG:  Cervicalgia  Cramp and spasm  Muscle weakness  (generalized)  Abnormal posture  Rationale for Evaluation and Treatment: Rehabilitation  ONSET DATE: 11/11/2022  SUBJECTIVE:                                                                                                                                                                                                         SUBJECTIVE STATEMENT: Patient reports that she thought the traction really helped. No pain today. Still getting intermittent nerve pain.     EVAL; Pain in neck for a while.  I was diagnosed with RA several months ago.   I have pain in all of my joints but my doctor did not think the pain in my neck was related to the RA.  She also c/o numbness in bilateral ulnar nerve distribution.  She works as a Transport planner.  She admits she has a lot of stress in life currently with her health issues and her sons new baby had to be in the NICU and soon after, the son had blood clots in both lungs.  She has frequent headaches as well and is a smoker.    Hand dominance: Right  PERTINENT HISTORY:  See above: multiple medical issues  PAIN:  Are you having pain? Yes: NPRS scale: 0/10 Pain location: neck  Pain description: increases to 4-5/10 at times, nagging gnawing Aggravating factors: sitting up Relieving factors: showers, ice packs, biofreeze, lidocaine, anti inflammtory  PRECAUTIONS: None  RED FLAGS: None     WEIGHT BEARING RESTRICTIONS: No  FALLS:  Has patient fallen in last 6 months? 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)  PLOF: Independent, Independent with basic ADLs, Independent with household mobility without device, Independent with community mobility without device, Independent with homemaking with ambulation, Independent with gait, and Independent with transfers  PATIENT GOALS: to reduce pain to manageable  NEXT MD VISIT: na  OBJECTIVE:    DIAGNOSTIC FINDINGS:  No recent imaging of the cervical spine  PATIENT SURVEYS:  FOTO 41, predicted 51 score based on patient feedback on current level of activity  COGNITION: Overall cognitive status: Within functional limits for tasks assessed  SENSATION: WFL  POSTURE:  elevated shoulders  PALPATION: Taut bands and trigger point bilateral upper traps and parascapular areas.     CERVICAL ROM:   Active ROM A/PROM (deg) eval  Flexion 45  Extension 50  Right lateral flexion 30  Left lateral flexion 32  Right rotation 45  Left rotation 40   (Blank rows = not tested)  UPPER EXTREMITY ROM:  WNL  UPPER EXTREMITY MMT: Shoulders generally 3+ to 4-/5,  elbow, forearm and wrist generally 4/5,    CERVICAL SPECIAL TESTS:  Spurling's test: Negative   TODAY'S TREATMENT:                                                                                                                              DATE:   12/08/22 UBE x 4 min 2 fwd/2bwd ER with scap retraction red Tband 2x10 Horizontal ABD green Tband 2x10 Rows 10# x 15 Ext green x20 Diagonals Green x 10 B Ulnar nerve glide in prayer position x 5 5 sec hold B Seated self traction  Manual: Seated manual traction with rotation x 5 L; SNAGS  to left Cspine into rotation and  flex/ext;  Mechanical cervical traction: 13# static x 10 min  11/28/22 UBE x 4 min 2 fwd/2bwd ER with scap retraction red Tband 2x10 Horizontal ABD green Tband 2x10 Rows blue x20 Ext green x20 Prone T and ext 2# x 16 Prone Y x 15 Ulnar nerve glide in prayer position x 5 5 sec hold B, no change Ulnar nerve glide hand to face L only 5 sec hold x 5 Seated self traction  Manual: Seated manual traction with rotation x 5 L; SNAGS  to left Cspine into R SB and flex/ext;  Mechanical cervical traction: 20# static x 10 min   11/24/22 UBE x 4 min 2 fwd/2bwd ER with scap retraction red Tband 2x10 Horizontal ABD Red Tband 2x10 Rows green x20 Ext green  x20 Ulnar nerve glide in prayer position x 5 5 sec hold B Prone on elbows neck diagonals x 10 ea  Manual: Seated manual traction with rotation x 5 ea; Skilled palpation and monitoring of soft tissues during DN. STM to B UT and cspine. CPA and UPA mobs to cspine, also rotational mobs and lateral glides. PROM in to Rotation. Trigger Point Dry-Needling  Treatment instructions: Expect mild to moderate muscle soreness. S/S of pneumothorax if dry needled over a lung field, and to seek immediate medical attention should they occur. Patient verbalized understanding of these instructions and education. Patient Consent Given: Yes Education handout provided: Previously provided Muscles treated: B cervical multifidi and UT Electrical stimulation performed: No Parameters: N/A Treatment response/outcome: Twitch Response Elicited and Palpable Increase in Muscle Length   9/4: Manual therapy: soft tissue mobilization to cervical paraspinals,  bil upper traps, bil levator scap and bil suboccipitals  Review of initial HEP Left levator scap stretch with 30 sec hold 3x Green band rows 10x Green band bil shoulder extension 10x Trigger Point Dry-Needling  Treatment instructions: Expect mild to moderate muscle soreness. S/S of pneumothorax if dry needled over a lung field, and to seek immediate medical attention should they occur. Patient verbalized understanding of these instructions and education.  Patient Consent Given: Yes Education handout provided: Yes Muscles treated: bil cervical multifidi, bil upper trap; left levator scap; bil subocciptals Electrical stimulation performed: No Parameters: N/A Treatment response/outcome: much improved soft tissue mobility and dec tender point size and number  Heat following 2 min   PATIENT EDUCATION:  Education details: HEP update Person educated: Patient Education method: Explanation, Demonstration, Verbal cues, and Handouts Education comprehension: verbalized  understanding, returned demonstration, and verbal cues required  HOME EXERCISE PROGRAM: Access Code: [email or web address] URL: [email or web address] Date: 11/28/2022 Prepared by: Raynelle Fanning  Exercises - Scapular Retraction with Resistance Advanced  - 2 x daily - 7 x weekly - 2 sets - 10 reps - Scapular Retraction with Resistance  - 2 x daily - 7 x weekly - 2 sets - 10 reps - Shoulder External Rotation and Scapular Retraction with Resistance  - 2 x daily - 7 x weekly - 2 sets - 10 reps - Standing Shoulder Horizontal Abduction with Resistance  - 1 x daily - 7 x weekly - 2 sets - 10 reps - Seated Passive Cervical Retraction  - 1 x daily - 7 x weekly - 3 sets - 10 reps - Seated Cervical Rotation AROM  - 1 x daily - 7 x weekly - 3 sets - 10 reps - Seated Cervical Sidebending AROM  - 1 x daily - 7 x weekly - 3 sets - 10 reps - Seated Cervical Sidebending Stretch  - 1 x daily - 7 x weekly - 3 sets - 10 reps - Seated Levator Scapulae Stretch  - 1 x daily - 7 x weekly - 1 sets - 10 reps - Ulnar Nerve Flossing  - 1 x daily - 7 x weekly - 1 sets - 10 reps - 2-3 hold  ASSESSMENT:  CLINICAL IMPRESSION: Kason presents today reporting overall improvements since last visit. She continues to have pain with left rotation and has a hard end feel, but the pain is less intense. Her sleeping has been better. She still awakens here and there with N/T in her hands, but this too has improved. She demonstrates +ulnar nerve tension in full stretch (hand to face) on the L. She hasn't been having headaches much either. She had a good response to static traction at her last visit, so this was repeated with increased pull to 20# today. Good response reporting less nerve tension in the L ulnar nerve after. No change in ROM. She demonstrates increased overall shakiness during therex today and reported feeling shaky as well, but is unsure of the cause.     OBJECTIVE IMPAIRMENTS: cardiopulmonary status limiting activity,  decreased activity tolerance, decreased endurance, decreased ROM, decreased strength, increased fascial restrictions, increased muscle spasms, impaired sensation, impaired UE functional use, postural dysfunction, and pain.   ACTIVITY LIMITATIONS: carrying, lifting, standing, sleeping, transfers, bed mobility, bathing, dressing, reach over head, and hygiene/grooming  PARTICIPATION LIMITATIONS: meal prep, cleaning, laundry, driving, shopping, community activity, occupation, and yard work  PERSONAL FACTORS: Behavior pattern, Fitness, Past/current experiences, Profession, and 3+ comorbidities: see multiple medical issues above, these include anxiety, depression, smoking,  RA  are also affecting patient's functional outcome.   REHAB POTENTIAL: Fair due to chronic pain history  CLINICAL DECISION MAKING: Evolving/moderate complexity  EVALUATION COMPLEXITY: Moderate   GOALS: Goals reviewed with patient? Yes  SHORT TERM GOALS: Target date: 12/10/2022   Pain report to be no greater than 4/10  Baseline:  Goal status: INITIAL  2.  Patient will be independent with initial HEP  Baseline:  Goal status: INITIAL   LONG TERM GOALS: Target date: 01/07/2023   Patient to report pain no greater than 2/10  Baseline:  Goal status: INITIAL  2.  Patient to be independent with advanced HEP  Baseline:  Goal status: INITIAL  3.  C spine ROM to improve by 3-5 degrees on rotation and side bending Baseline:  Goal status: INITIAL  4.  Patient to report 85% improvement in overall symptoms Baseline:  Goal status: INITIAL  5.  UE strength to improve to 4/5 bilateral shoulders and 4+/5 bilateral elbow, wrist and hand Baseline:  Goal status: INITIAL  6.  Patient to be able to sleep through the night  Baseline:  Goal status: INITIAL   PLAN:  PT FREQUENCY: 1-2x/week  PT DURATION: 8 weeks  PLANNED INTERVENTIONS: Therapeutic exercises, Therapeutic activity, Neuromuscular re-education,  Patient/Family education, Self Care, Joint mobilization, Aquatic Therapy, Dry Needling, Electrical stimulation, Spinal mobilization, Cryotherapy, Moist heat, Taping, Traction, Ultrasound, Manual therapy, and Re-evaluation  PLAN FOR NEXT SESSION: UBE, continue with traction  Solon Palm, PT 12/08/22 2:46 PM Phone: [phone] Fax: [phone]  [facility] [address], [address] [place], [place] [address] Phone # [phone] Fax [phone]

## 2022-12-08 ENCOUNTER — Encounter: Payer: Self-pay | Admitting: Physical Therapy

## 2022-12-08 ENCOUNTER — Ambulatory Visit: Payer: BC Managed Care – PPO | Admitting: Physical Therapy

## 2022-12-08 DIAGNOSIS — M6281 Muscle weakness (generalized): Secondary | ICD-10-CM

## 2022-12-08 DIAGNOSIS — R252 Cramp and spasm: Secondary | ICD-10-CM | POA: Diagnosis not present

## 2022-12-08 DIAGNOSIS — M542 Cervicalgia: Secondary | ICD-10-CM

## 2022-12-08 DIAGNOSIS — R293 Abnormal posture: Secondary | ICD-10-CM

## 2022-12-12 ENCOUNTER — Ambulatory Visit: Payer: BC Managed Care – PPO | Admitting: Physical Therapy

## 2022-12-12 DIAGNOSIS — R252 Cramp and spasm: Secondary | ICD-10-CM | POA: Diagnosis not present

## 2022-12-12 DIAGNOSIS — M6281 Muscle weakness (generalized): Secondary | ICD-10-CM | POA: Diagnosis not present

## 2022-12-12 DIAGNOSIS — M542 Cervicalgia: Secondary | ICD-10-CM | POA: Diagnosis not present

## 2022-12-12 DIAGNOSIS — R293 Abnormal posture: Secondary | ICD-10-CM | POA: Diagnosis not present

## 2022-12-12 NOTE — Therapy (Signed)
OUTPATIENT PHYSICAL THERAPY CERVICAL PROGRESS NOTE   Patient Name: Krystal Mercado MRN: [id number] DOB:April 30, 1970, 52 y.o., female Today's Date: 12/12/2022  END OF SESSION:  PT End of Session - 12/12/22 1100     Visit Number 6    Date for PT Re-Evaluation 01/07/23    Authorization Type [employer]    Progress Note Due on Visit 10    PT Start Time 1101    PT Stop Time 1142    PT Time Calculation (min) 41 min    Activity Tolerance Patient tolerated treatment well                Past Medical History:  Diagnosis Date   Allergy 2003   allergic rhinitis   Anxiety    Barrett's esophagus    Blood clot in vein    in thumb, had part of thumb amputated, no follow up   GERD (gastroesophageal reflux disease)    Observation or evaluation for suspected condition 04/28/2007   heme Evaluation no known pathology (Dr.Metjian, [facility])   Raynaud's disease    Skin necrosis (HCC)    left thumb- vascular eval at [facility]   Past Surgical History:  Procedure Laterality Date   ABDOMINAL HYSTERECTOMY     CHOLECYSTECTOMY  1995   conization, BTL   COLONOSCOPY N/A 05/20/2021   Procedure: COLONOSCOPYn WITH BIOPSY;  Surgeon: Midge Minium, MD;  Location: [facility];  Service: Endoscopy;  Laterality: N/A;   COLONOSCOPY WITH PROPOFOL N/A 08/13/2017   Procedure: COLONOSCOPY WITH PROPOFOL;  Surgeon: Midge Minium, MD;  Location: [facility];  Service: Endoscopy;  Laterality: N/A;   CYSTECTOMY  1990   cyst in fallopian tube removed, Tube intact   ESOPHAGOGASTRODUODENOSCOPY N/A 05/20/2021   Procedure: ESOPHAGOGASTRODUODENOSCOPY (EGD) WITH BIOPSY;  Surgeon: Midge Minium, MD;  Location: [facility];  Service: Endoscopy;  Laterality: N/A;   ESOPHAGOGASTRODUODENOSCOPY (EGD) WITH PROPOFOL N/A 08/13/2017   Procedure: ESOPHAGOGASTRODUODENOSCOPY (EGD) WITH PROPOFOL;  Surgeon: Midge Minium, MD;  Location: [facility];  Service: Endoscopy;  Laterality: N/A;   GROIN DISSECTION  05/2000    Right groin Lymph node dissection for cat scratch Disease   MRI  07/2004   negative except sinusitis   POLYPECTOMY N/A 08/13/2017   Procedure: POLYPECTOMY;  Surgeon: Midge Minium, MD;  Location: [facility];  Service: Endoscopy;  Laterality: N/A;   POLYPECTOMY N/A 05/20/2021   Procedure: POLYPECTOMY;  Surgeon: Midge Minium, MD;  Location: [facility];  Service: Endoscopy;  Laterality: N/A;   Thoracic aortogram  09/23/2006   Thoracic aortogram with select LUE arteriogram small radial artery below the brach bifurcation   TUBAL LIGATION  1995   conization, BTL; choleycystectomy   VAGINAL DELIVERY     X's 2   Patient Active Problem List   Diagnosis Date Noted   Low back pain 05/28/2022   Contact with or exposure to mold 01/22/2022   Left hip pain 01/22/2022   Tachycardia 09/01/2021   Personal history of colonic polyps    Polyp of sigmoid colon    Acute non-recurrent sinusitis 04/22/2021   Bursitis 12/26/2020   Routine general medical examination at a health care facility 11/13/2019   Advance care planning 11/13/2019   Hyperlipidemia 11/13/2019   Complex cyst of right ovary 10/09/2017   Benign neoplasm of transverse colon    Barrett's esophagus    SUI (stress urinary incontinence, female) 05/31/2017   Ocular migraine 05/31/2017   Radicular pain in left arm 05/31/2017   SKIN LESIONS,  MULTIPLE 08/31/2007   RAYNAUDS SYNDROME 07/26/2007   VERTIGO 03/25/2007   Anxiety state 11/20/2006   TOBACCO ABUSE 09/22/2006   ALLERGIC RHINITIS 09/21/2006   ARTHRITIS 09/21/2006   HX, PERSONAL, TOBACCO USE 09/21/2006   HEADACHE, CHRONIC, HX OF 09/21/2006    PCP: Crawford Givens, MD  REFERRING PROVIDER: Lisbeth Renshaw, MD  REFERRING DIAG: [phone] (ICD-10-CM) - Spondylosis without myelopathy or radiculopathy, cervical region  THERAPY DIAG:  Cervicalgia  Cramp and spasm  Muscle weakness (generalized)  Rationale for Evaluation and Treatment: Rehabilitation  ONSET DATE:  11/11/2022  SUBJECTIVE:                                                                                                                                                                                                         SUBJECTIVE STATEMENT: Left upper trap pain mild, just annoying;  Last 2 fingers are numb every night and here and there throughout the day.   DN and traction help the most.  Reports she has a good understanding of her HEP including band ex's and nerve glides.     EVAL; Pain in neck for a while.  I was diagnosed with RA several months ago.   I have pain in all of my joints but my doctor did not think the pain in my neck was related to the RA.  She also c/o numbness in bilateral ulnar nerve distribution.  She works as a Transport planner.  She admits she has a lot of stress in life currently with her health issues and her sons new baby had to be in the NICU and soon after, the son had blood clots in both lungs.  She has frequent headaches as well and is a smoker.    Hand dominance: Right  PERTINENT HISTORY:  See above: multiple medical issues  PAIN:  Are you having pain? Yes: NPRS scale: 2/10 Pain location: neck  Pain description: increases to /10 at times, nagging gnawing Aggravating factors: sitting up Relieving factors: showers, ice packs, biofreeze, lidocaine, anti inflammtory  PRECAUTIONS: None  RED FLAGS: None     WEIGHT BEARING RESTRICTIONS: No  FALLS:  Has patient fallen in last 6 months? 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)  PLOF: Independent, Independent with basic ADLs, Independent with household mobility without device, Independent with community mobility without device, Independent with homemaking with ambulation, Independent with gait, and Independent with transfers  PATIENT GOALS: to reduce pain to manageable  NEXT MD VISIT: na  OBJECTIVE:    DIAGNOSTIC FINDINGS:  No recent imaging of the cervical spine  PATIENT SURVEYS:  FOTO 41, predicted 51 score based on patient feedback on current level of activity  COGNITION: Overall cognitive status: Within functional limits for tasks assessed  SENSATION: WFL  POSTURE:  elevated shoulders  PALPATION: Taut bands and trigger point bilateral upper traps and parascapular areas.     CERVICAL ROM:   Active ROM A/PROM (deg) eval 9/27  Flexion 45 60  Extension 50 55  Right lateral flexion 30 32  Left lateral flexion 32 30  Right rotation 45 30  Left rotation 40 20   (Blank rows = not tested)  UPPER EXTREMITY ROM:  WNL  UPPER EXTREMITY MMT: Shoulders generally 3+ to 4-/5,  elbow, forearm and wrist generally 4/5,    CERVICAL SPECIAL TESTS:  Spurling's test: Negative   TODAY'S TREATMENT:                                                                                                                              DATE:  12/12/22 UBE x 4 min 2 fwd/2bwd Review of HEP Cervical ROM Manual: Seated manual traction with rotation x 5 ea; Skilled palpation and monitoring of soft tissues during DN. STM to B UT and cspine.  Trigger Point Dry-Needling  Treatment instructions: Expect mild to moderate muscle soreness. S/S of pneumothorax if dry needled over a lung field, and to seek immediate medical attention should they occur. Patient verbalized understanding of these instructions and education. Patient Consent Given: Yes Education handout provided: Previously provided Muscles treated: Left cervical multifidi ;left UT Electrical stimulation performed: No Parameters: N/A Treatment response/outcome: Twitch Response Elicited and Palpable Increase in Muscle Length  Mechanical cervical traction: 13# intermittent 30 sec on/15 sec off x 10 min 12/08/22 UBE x 4 min 2 fwd/2bwd ER with scap retraction red Tband 2x10 Horizontal ABD green Tband 2x10 Rows 10# x 15 Ext green x20 Diagonals  Green x 10 B Ulnar nerve glide in prayer position x 5 5 sec hold B Seated self traction  Manual: Seated manual traction with rotation x 5 L; SNAGS  to left Cspine into rotation and  flex/ext;  Mechanical cervical traction: 13# static x 10 min  11/28/22 UBE x 4 min 2 fwd/2bwd ER with scap retraction red Tband 2x10 Horizontal ABD green Tband 2x10 Rows blue x20 Ext green x20 Prone T and ext 2# x 16 Prone Y x 15 Ulnar nerve glide in prayer position x 5 5 sec hold B, no change Ulnar nerve glide hand to face L only 5 sec hold x 5 Seated self traction  Manual: Seated manual traction with rotation x 5 L; SNAGS  to left Cspine into R SB and flex/ext;  Mechanical cervical traction: 20# static x 10 min   11/24/22 UBE x 4 min 2 fwd/2bwd ER with scap retraction red Tband 2x10 Horizontal ABD Red Tband 2x10 Rows green x20 Ext green x20  Ulnar nerve glide in prayer position x 5 5 sec hold B Prone on elbows neck diagonals x 10 ea  Manual: Seated manual traction with rotation x 5 ea; Skilled palpation and monitoring of soft tissues during DN. STM to B UT and cspine. CPA and UPA mobs to cspine, also rotational mobs and lateral glides. PROM in to Rotation. Trigger Point Dry-Needling  Treatment instructions: Expect mild to moderate muscle soreness. S/S of pneumothorax if dry needled over a lung field, and to seek immediate medical attention should they occur. Patient verbalized understanding of these instructions and education. Patient Consent Given: Yes Education handout provided: Previously provided Muscles treated: B cervical multifidi and UT Electrical stimulation performed: No Parameters: N/A Treatment response/outcome: Twitch Response Elicited and Palpable Increase in Muscle Length     PATIENT EDUCATION:  Education details: HEP update Person educated: Patient Education method: Explanation, Demonstration, Verbal cues, and Handouts Education comprehension: verbalized understanding, returned  demonstration, and verbal cues required  HOME EXERCISE PROGRAM: Access Code: [email or web address] URL: [email or web address] Date: 11/28/2022 Prepared by: Raynelle Fanning  Exercises - Scapular Retraction with Resistance Advanced  - 2 x daily - 7 x weekly - 2 sets - 10 reps - Scapular Retraction with Resistance  - 2 x daily - 7 x weekly - 2 sets - 10 reps - Shoulder External Rotation and Scapular Retraction with Resistance  - 2 x daily - 7 x weekly - 2 sets - 10 reps - Standing Shoulder Horizontal Abduction with Resistance  - 1 x daily - 7 x weekly - 2 sets - 10 reps - Seated Passive Cervical Retraction  - 1 x daily - 7 x weekly - 3 sets - 10 reps - Seated Cervical Rotation AROM  - 1 x daily - 7 x weekly - 3 sets - 10 reps - Seated Cervical Sidebending AROM  - 1 x daily - 7 x weekly - 3 sets - 10 reps - Seated Cervical Sidebending Stretch  - 1 x daily - 7 x weekly - 3 sets - 10 reps - Seated Levator Scapulae Stretch  - 1 x daily - 7 x weekly - 1 sets - 10 reps - Ulnar Nerve Flossing  - 1 x daily - 7 x weekly - 1 sets - 10 reps - 2-3 hold  ASSESSMENT:  CLINICAL IMPRESSION: Decreasing overall symptom intensity with 4th and 5th finger presence on an intermittent basis.  The patient benefits significantly from dry needling and manual therapy to stimulate underlying myofascial trigger points and muscular tissue for management of neuromusculoskeletal pain and address movement impairments.  Much improved soft tissue mobility and decreased tender point size and number following treatment session.   She also responds well to mechanical traction for neck pain relief. Therapist monitoring response to all interventions and modifying treatment accordingly. STGs met.   OBJECTIVE IMPAIRMENTS: cardiopulmonary status limiting activity, decreased activity tolerance, decreased endurance, decreased ROM, decreased strength, increased fascial restrictions, increased muscle spasms, impaired sensation, impaired UE  functional use, postural dysfunction, and pain.   ACTIVITY LIMITATIONS: carrying, lifting, standing, sleeping, transfers, bed mobility, bathing, dressing, reach over head, and hygiene/grooming  PARTICIPATION LIMITATIONS: meal prep, cleaning, laundry, driving, shopping, community activity, occupation, and yard work  PERSONAL FACTORS: Behavior pattern, Fitness, Past/current experiences, Profession, and 3+ comorbidities: see multiple medical issues above, these include anxiety, depression, smoking, RA  are also affecting patient's functional outcome.   REHAB POTENTIAL: Fair due to chronic pain history  CLINICAL DECISION MAKING: Evolving/moderate complexity  EVALUATION COMPLEXITY: Moderate   GOALS:  Goals reviewed with patient? Yes  SHORT TERM GOALS: Target date: 12/10/2022   Pain report to be no greater than 4/10  Baseline:  Goal status: met 9/27  2.  Patient will be independent with initial HEP  Baseline:  Goal status: met 9/27   LONG TERM GOALS: Target date: 01/07/2023   Patient to report pain no greater than 2/10  Baseline:  Goal status: INITIAL  2.  Patient to be independent with advanced HEP  Baseline:  Goal status: INITIAL  3.  C spine ROM to improve by 3-5 degrees on rotation and side bending Baseline:  Goal status: INITIAL  4.  Patient to report 85% improvement in overall symptoms Baseline:  Goal status: INITIAL  5.  UE strength to improve to 4/5 bilateral shoulders and 4+/5 bilateral elbow, wrist and hand Baseline:  Goal status: INITIAL  6.  Patient to be able to sleep through the night  Baseline:  Goal status: INITIAL   PLAN:  PT FREQUENCY: 1-2x/week  PT DURATION: 8 weeks  PLANNED INTERVENTIONS: Therapeutic exercises, Therapeutic activity, Neuromuscular re-education, Patient/Family education, Self Care, Joint mobilization, Aquatic Therapy, Dry Needling, Electrical stimulation, Spinal mobilization, Cryotherapy, Moist heat, Taping, Traction,  Ultrasound, Manual therapy, and Re-evaluation  PLAN FOR NEXT SESSION: UBE, continue with traction; DN as needed   Lavinia Sharps, PT 12/12/22 11:44 AM Phone: [phone] Fax: [phone]   [facility] [address], [address] [place], [place] [address] Phone # [phone] Fax [phone]

## 2022-12-12 NOTE — Therapy (Incomplete)
OUTPATIENT PHYSICAL THERAPY CERVICAL PROGRESS NOTE   Patient Name: Krystal Mercado MRN: [id number] DOB:1970/09/23, 52 y.o., female Today's Date: 12/12/2022  END OF SESSION:       Past Medical History:  Diagnosis Date   Allergy 2003   allergic rhinitis   Anxiety    Barrett's esophagus    Blood clot in vein    in thumb, had part of thumb amputated, no follow up   GERD (gastroesophageal reflux disease)    Observation or evaluation for suspected condition 04/28/2007   heme Evaluation no known pathology (Dr.Metjian, [facility])   Raynaud's disease    Skin necrosis (HCC)    left thumb- vascular eval at [facility]   Past Surgical History:  Procedure Laterality Date   ABDOMINAL HYSTERECTOMY     CHOLECYSTECTOMY  1995   conization, BTL   COLONOSCOPY N/A 05/20/2021   Procedure: COLONOSCOPYn WITH BIOPSY;  Surgeon: Midge Minium, MD;  Location: [facility];  Service: Endoscopy;  Laterality: N/A;   COLONOSCOPY WITH PROPOFOL N/A 08/13/2017   Procedure: COLONOSCOPY WITH PROPOFOL;  Surgeon: Midge Minium, MD;  Location: [facility];  Service: Endoscopy;  Laterality: N/A;   CYSTECTOMY  1990   cyst in fallopian tube removed, Tube intact   ESOPHAGOGASTRODUODENOSCOPY N/A 05/20/2021   Procedure: ESOPHAGOGASTRODUODENOSCOPY (EGD) WITH BIOPSY;  Surgeon: Midge Minium, MD;  Location: [facility];  Service: Endoscopy;  Laterality: N/A;   ESOPHAGOGASTRODUODENOSCOPY (EGD) WITH PROPOFOL N/A 08/13/2017   Procedure: ESOPHAGOGASTRODUODENOSCOPY (EGD) WITH PROPOFOL;  Surgeon: Midge Minium, MD;  Location: [facility];  Service: Endoscopy;  Laterality: N/A;   GROIN DISSECTION  05/2000   Right groin Lymph node dissection for cat scratch Disease   MRI  07/2004   negative except sinusitis   POLYPECTOMY N/A 08/13/2017   Procedure: POLYPECTOMY;  Surgeon: Midge Minium, MD;  Location: [facility];  Service: Endoscopy;  Laterality: N/A;   POLYPECTOMY N/A 05/20/2021   Procedure:  POLYPECTOMY;  Surgeon: Midge Minium, MD;  Location: [facility];  Service: Endoscopy;  Laterality: N/A;   Thoracic aortogram  09/23/2006   Thoracic aortogram with select LUE arteriogram small radial artery below the brach bifurcation   TUBAL LIGATION  1995   conization, BTL; choleycystectomy   VAGINAL DELIVERY     X's 2   Patient Active Problem List   Diagnosis Date Noted   Low back pain 05/28/2022   Contact with or exposure to mold 01/22/2022   Left hip pain 01/22/2022   Tachycardia 09/01/2021   Personal history of colonic polyps    Polyp of sigmoid colon    Acute non-recurrent sinusitis 04/22/2021   Bursitis 12/26/2020   Routine general medical examination at a health care facility 11/13/2019   Advance care planning 11/13/2019   Hyperlipidemia 11/13/2019   Complex cyst of right ovary 10/09/2017   Benign neoplasm of transverse colon    Barrett's esophagus    SUI (stress urinary incontinence, female) 05/31/2017   Ocular migraine 05/31/2017   Radicular pain in left arm 05/31/2017   SKIN LESIONS, MULTIPLE 08/31/2007   RAYNAUDS SYNDROME 07/26/2007   VERTIGO 03/25/2007   Anxiety state 11/20/2006   TOBACCO ABUSE 09/22/2006   ALLERGIC RHINITIS 09/21/2006   ARTHRITIS 09/21/2006   HX, PERSONAL, TOBACCO USE 09/21/2006   HEADACHE, CHRONIC, HX OF 09/21/2006    PCP: Crawford Givens, MD  REFERRING PROVIDER: Lisbeth Renshaw, MD  REFERRING DIAG: [phone] (ICD-10-CM) - Spondylosis without myelopathy or radiculopathy, cervical region  THERAPY DIAG:  No diagnosis found.  Rationale  for Evaluation and Treatment: Rehabilitation  ONSET DATE: 11/11/2022  SUBJECTIVE:                                                                                                                                                                                                         SUBJECTIVE STATEMENT: ***    EVAL; Pain in neck for a while.  I was diagnosed with RA several months ago.   I  have pain in all of my joints but my doctor did not think the pain in my neck was related to the RA.  She also c/o numbness in bilateral ulnar nerve distribution.  She works as a Transport planner.  She admits she has a lot of stress in life currently with her health issues and her sons new baby had to be in the NICU and soon after, the son had blood clots in both lungs.  She has frequent headaches as well and is a smoker.    Hand dominance: Right  PERTINENT HISTORY:  See above: multiple medical issues  PAIN:  Are you having pain? Yes: NPRS scale: 0/10 Pain location: neck  Pain description: increases to 4-5/10 at times, nagging gnawing Aggravating factors: sitting up Relieving factors: showers, ice packs, biofreeze, lidocaine, anti inflammtory  PRECAUTIONS: None  RED FLAGS: None     WEIGHT BEARING RESTRICTIONS: No  FALLS:  Has patient fallen in last 6 months? 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)  PLOF: Independent, Independent with basic ADLs, Independent with household mobility without device, Independent with community mobility without device, Independent with homemaking with ambulation, Independent with gait, and Independent with transfers  PATIENT GOALS: to reduce pain to manageable  NEXT MD VISIT: na  OBJECTIVE:   DIAGNOSTIC FINDINGS:  No recent imaging of the cervical spine  PATIENT SURVEYS:  FOTO 41, predicted 51 score based on patient feedback on current level of activity  COGNITION: Overall cognitive status: Within functional limits for tasks assessed  SENSATION: WFL  POSTURE:  elevated shoulders  PALPATION: Taut bands and trigger point bilateral upper traps and parascapular areas.     CERVICAL ROM:   Active ROM A/PROM (deg) eval  Flexion 45  Extension 50  Right lateral flexion 30  Left lateral flexion 32  Right rotation 45  Left rotation 40    (Blank rows = not tested)  UPPER EXTREMITY ROM:  WNL  UPPER EXTREMITY MMT: Shoulders generally 3+ to 4-/5,  elbow, forearm and wrist generally 4/5,  CERVICAL SPECIAL TESTS:  Spurling's test: Negative   TODAY'S TREATMENT:                                                                                                                              DATE:   12/15/22 UBE x 4 min 2 fwd/2bwd ER with scap retraction red Tband 2x10 Horizontal ABD green Tband 2x10 Rows 10# x 15 Ext green x20 Diagonals Green x 10 B Ulnar nerve glide in prayer position x 5 5 sec hold B Seated self traction  Manual: Seated manual traction with rotation x 5 L; SNAGS  to left Cspine into rotation and  flex/ext;  Mechanical cervical traction: 13# static x 10 min  12/08/22 UBE x 4 min 2 fwd/2bwd ER with scap retraction red Tband 2x10 Horizontal ABD green Tband 2x10 Rows 10# x 15 Ext green x20 Diagonals Green x 10 B Ulnar nerve glide in prayer position x 5 5 sec hold B Seated self traction  Manual: Seated manual traction with rotation x 5 L; SNAGS  to left Cspine into rotation and  flex/ext;  Mechanical cervical traction: 13# static x 10 min  11/28/22 UBE x 4 min 2 fwd/2bwd ER with scap retraction red Tband 2x10 Horizontal ABD green Tband 2x10 Rows blue x20 Ext green x20 Prone T and ext 2# x 16 Prone Y x 15 Ulnar nerve glide in prayer position x 5 5 sec hold B, no change Ulnar nerve glide hand to face L only 5 sec hold x 5 Seated self traction  Manual: Seated manual traction with rotation x 5 L; SNAGS  to left Cspine into R SB and flex/ext;  Mechanical cervical traction: 20# static x 10 min   11/24/22 UBE x 4 min 2 fwd/2bwd ER with scap retraction red Tband 2x10 Horizontal ABD Red Tband 2x10 Rows green x20 Ext green x20 Ulnar nerve glide in prayer position x 5 5 sec hold B Prone on elbows neck diagonals x 10 ea  Manual: Seated manual traction with rotation x 5 ea; Skilled palpation and  monitoring of soft tissues during DN. STM to B UT and cspine. CPA and UPA mobs to cspine, also rotational mobs and lateral glides. PROM in to Rotation. Trigger Point Dry-Needling  Treatment instructions: Expect mild to moderate muscle soreness. S/S of pneumothorax if dry needled over a lung field, and to seek immediate medical attention should they occur. Patient verbalized understanding of these instructions and education. Patient Consent Given: Yes Education handout provided: Previously provided Muscles treated: B cervical multifidi and UT Electrical stimulation performed: No Parameters: N/A Treatment response/outcome: Twitch Response Elicited and Palpable Increase in Muscle Length   9/4: Manual therapy: soft tissue mobilization to cervical paraspinals, bil upper traps, bil levator scap and bil suboccipitals  Review of initial HEP Left levator scap stretch with 30 sec hold 3x Green band rows 10x Green band bil shoulder extension 10x Trigger Point Dry-Needling  Treatment instructions: Expect mild to  moderate muscle soreness. S/S of pneumothorax if dry needled over a lung field, and to seek immediate medical attention should they occur. Patient verbalized understanding of these instructions and education.  Patient Consent Given: Yes Education handout provided: Yes Muscles treated: bil cervical multifidi, bil upper trap; left levator scap; bil subocciptals Electrical stimulation performed: No Parameters: N/A Treatment response/outcome: much improved soft tissue mobility and dec tender point size and number  Heat following 2 min   PATIENT EDUCATION:  Education details: HEP update Person educated: Patient Education method: Explanation, Demonstration, Verbal cues, and Handouts Education comprehension: verbalized understanding, returned demonstration, and verbal cues required  HOME EXERCISE PROGRAM: Access Code: [email or web address] URL: [email or web address] Date:  11/28/2022 Prepared by: Raynelle Fanning  Exercises - Scapular Retraction with Resistance Advanced  - 2 x daily - 7 x weekly - 2 sets - 10 reps - Scapular Retraction with Resistance  - 2 x daily - 7 x weekly - 2 sets - 10 reps - Shoulder External Rotation and Scapular Retraction with Resistance  - 2 x daily - 7 x weekly - 2 sets - 10 reps - Standing Shoulder Horizontal Abduction with Resistance  - 1 x daily - 7 x weekly - 2 sets - 10 reps - Seated Passive Cervical Retraction  - 1 x daily - 7 x weekly - 3 sets - 10 reps - Seated Cervical Rotation AROM  - 1 x daily - 7 x weekly - 3 sets - 10 reps - Seated Cervical Sidebending AROM  - 1 x daily - 7 x weekly - 3 sets - 10 reps - Seated Cervical Sidebending Stretch  - 1 x daily - 7 x weekly - 3 sets - 10 reps - Seated Levator Scapulae Stretch  - 1 x daily - 7 x weekly - 1 sets - 10 reps - Ulnar Nerve Flossing  - 1 x daily - 7 x weekly - 1 sets - 10 reps - 2-3 hold  ASSESSMENT:  CLINICAL IMPRESSION: *** Ameelia presents today reporting overall improvements since last visit. She continues to have pain with left rotation and has a hard end feel, but the pain is less intense. Her sleeping has been better. She still awakens here and there with N/T in her hands, but this too has improved. She demonstrates +ulnar nerve tension in full stretch (hand to face) on the L. She hasn't been having headaches much either. She had a good response to static traction at her last visit, so this was repeated with increased pull to 20# today. Good response reporting less nerve tension in the L ulnar nerve after. No change in ROM. She demonstrates increased overall shakiness during therex today and reported feeling shaky as well, but is unsure of the cause.     OBJECTIVE IMPAIRMENTS: cardiopulmonary status limiting activity, decreased activity tolerance, decreased endurance, decreased ROM, decreased strength, increased fascial restrictions, increased muscle spasms, impaired sensation,  impaired UE functional use, postural dysfunction, and pain.   ACTIVITY LIMITATIONS: carrying, lifting, standing, sleeping, transfers, bed mobility, bathing, dressing, reach over head, and hygiene/grooming  PARTICIPATION LIMITATIONS: meal prep, cleaning, laundry, driving, shopping, community activity, occupation, and yard work  PERSONAL FACTORS: Behavior pattern, Fitness, Past/current experiences, Profession, and 3+ comorbidities: see multiple medical issues above, these include anxiety, depression, smoking, RA  are also affecting patient's functional outcome.   REHAB POTENTIAL: Fair due to chronic pain history  CLINICAL DECISION MAKING: Evolving/moderate complexity  EVALUATION COMPLEXITY: Moderate   GOALS: Goals reviewed with patient? Yes  SHORT TERM GOALS: Target  date: 12/10/2022   Pain report to be no greater than 4/10  Baseline:  Goal status: INITIAL  2.  Patient will be independent with initial HEP  Baseline:  Goal status: INITIAL   LONG TERM GOALS: Target date: 01/07/2023   Patient to report pain no greater than 2/10  Baseline:  Goal status: INITIAL  2.  Patient to be independent with advanced HEP  Baseline:  Goal status: INITIAL  3.  C spine ROM to improve by 3-5 degrees on rotation and side bending Baseline:  Goal status: INITIAL  4.  Patient to report 85% improvement in overall symptoms Baseline:  Goal status: INITIAL  5.  UE strength to improve to 4/5 bilateral shoulders and 4+/5 bilateral elbow, wrist and hand Baseline:  Goal status: INITIAL  6.  Patient to be able to sleep through the night  Baseline:  Goal status: INITIAL   PLAN:  PT FREQUENCY: 1-2x/week  PT DURATION: 8 weeks  PLANNED INTERVENTIONS: Therapeutic exercises, Therapeutic activity, Neuromuscular re-education, Patient/Family education, Self Care, Joint mobilization, Aquatic Therapy, Dry Needling, Electrical stimulation, Spinal mobilization, Cryotherapy, Moist heat, Taping,  Traction, Ultrasound, Manual therapy, and Re-evaluation  PLAN FOR NEXT SESSION: UBE, continue with traction   Solon Palm, PT 12/12/22 9:34 AM Phone: [phone] Fax: [phone]  [facility] [address] [place], [place] [address] Phone # [phone] Fax [phone]

## 2022-12-14 ENCOUNTER — Other Ambulatory Visit: Payer: Self-pay | Admitting: Family Medicine

## 2022-12-15 ENCOUNTER — Encounter: Payer: Self-pay | Admitting: Physical Therapy

## 2022-12-15 ENCOUNTER — Ambulatory Visit: Payer: BC Managed Care – PPO | Admitting: Physical Therapy

## 2022-12-15 DIAGNOSIS — R293 Abnormal posture: Secondary | ICD-10-CM

## 2022-12-15 DIAGNOSIS — M6281 Muscle weakness (generalized): Secondary | ICD-10-CM

## 2022-12-15 DIAGNOSIS — R252 Cramp and spasm: Secondary | ICD-10-CM | POA: Diagnosis not present

## 2022-12-15 DIAGNOSIS — M542 Cervicalgia: Secondary | ICD-10-CM

## 2022-12-15 NOTE — Therapy (Signed)
OUTPATIENT PHYSICAL THERAPY CERVICAL PROGRESS NOTE AND DISCHARGE SUMMARY   Patient Name: Krystal Mercado MRN: [id number] DOB:08-15-1970, 52 y.o., female Today's Date: 12/15/2022  END OF SESSION:  PT End of Session - 12/15/22 1412     Visit Number 7    Date for PT Re-Evaluation 01/07/23    Authorization Type [employer]    Progress Note Due on Visit 10    PT Start Time 1402    PT Stop Time 1447    PT Time Calculation (min) 45 min    Activity Tolerance Patient tolerated treatment well    Behavior During Therapy [facility] for tasks assessed/performed                 Past Medical History:  Diagnosis Date   Allergy 2003   allergic rhinitis   Anxiety    Barrett's esophagus    Blood clot in vein    in thumb, had part of thumb amputated, no follow up   GERD (gastroesophageal reflux disease)    Observation or evaluation for suspected condition 04/28/2007   heme Evaluation no known pathology (Dr.Metjian, [facility])   Raynaud's disease    Skin necrosis (HCC)    left thumb- vascular eval at [facility]   Past Surgical History:  Procedure Laterality Date   ABDOMINAL HYSTERECTOMY     CHOLECYSTECTOMY  1995   conization, BTL   COLONOSCOPY N/A 05/20/2021   Procedure: COLONOSCOPYn WITH BIOPSY;  Surgeon: Midge Minium, MD;  Location: [facility];  Service: Endoscopy;  Laterality: N/A;   COLONOSCOPY WITH PROPOFOL N/A 08/13/2017   Procedure: COLONOSCOPY WITH PROPOFOL;  Surgeon: Midge Minium, MD;  Location: [facility];  Service: Endoscopy;  Laterality: N/A;   CYSTECTOMY  1990   cyst in fallopian tube removed, Tube intact   ESOPHAGOGASTRODUODENOSCOPY N/A 05/20/2021   Procedure: ESOPHAGOGASTRODUODENOSCOPY (EGD) WITH BIOPSY;  Surgeon: Midge Minium, MD;  Location: [facility];  Service: Endoscopy;  Laterality: N/A;   ESOPHAGOGASTRODUODENOSCOPY (EGD) WITH PROPOFOL N/A 08/13/2017   Procedure: ESOPHAGOGASTRODUODENOSCOPY (EGD) WITH PROPOFOL;  Surgeon: Midge Minium, MD;  Location:  [facility];  Service: Endoscopy;  Laterality: N/A;   GROIN DISSECTION  05/2000   Right groin Lymph node dissection for cat scratch Disease   MRI  07/2004   negative except sinusitis   POLYPECTOMY N/A 08/13/2017   Procedure: POLYPECTOMY;  Surgeon: Midge Minium, MD;  Location: [facility];  Service: Endoscopy;  Laterality: N/A;   POLYPECTOMY N/A 05/20/2021   Procedure: POLYPECTOMY;  Surgeon: Midge Minium, MD;  Location: [facility];  Service: Endoscopy;  Laterality: N/A;   Thoracic aortogram  09/23/2006   Thoracic aortogram with select LUE arteriogram small radial artery below the brach bifurcation   TUBAL LIGATION  1995   conization, BTL; choleycystectomy   VAGINAL DELIVERY     X's 2   Patient Active Problem List   Diagnosis Date Noted   Low back pain 05/28/2022   Contact with or exposure to mold 01/22/2022   Left hip pain 01/22/2022   Tachycardia 09/01/2021   Personal history of colonic polyps    Polyp of sigmoid colon    Acute non-recurrent sinusitis 04/22/2021   Bursitis 12/26/2020   Routine general medical examination at a health care facility 11/13/2019   Advance care planning 11/13/2019   Hyperlipidemia 11/13/2019   Complex cyst of right ovary 10/09/2017   Benign neoplasm of transverse colon    Barrett's esophagus    SUI (stress urinary incontinence, female) 05/31/2017   Ocular  migraine 05/31/2017   Radicular pain in left arm 05/31/2017   SKIN LESIONS, MULTIPLE 08/31/2007   RAYNAUDS SYNDROME 07/26/2007   VERTIGO 03/25/2007   Anxiety state 11/20/2006   TOBACCO ABUSE 09/22/2006   ALLERGIC RHINITIS 09/21/2006   ARTHRITIS 09/21/2006   HX, PERSONAL, TOBACCO USE 09/21/2006   HEADACHE, CHRONIC, HX OF 09/21/2006    PCP: Crawford Givens, MD  REFERRING PROVIDER: Lisbeth Renshaw, MD  REFERRING DIAG: [phone] (ICD-10-CM) - Spondylosis without myelopathy or radiculopathy, cervical region  THERAPY DIAG:  Cervicalgia  Muscle weakness  (generalized)  Cramp and spasm  Abnormal posture  Rationale for Evaluation and Treatment: Rehabilitation  ONSET DATE: 11/11/2022  SUBJECTIVE:                                                                                                                                                                                                         SUBJECTIVE STATEMENT: It's the same.    EVAL; Pain in neck for a while.  I was diagnosed with RA several months ago.   I have pain in all of my joints but my doctor did not think the pain in my neck was related to the RA.  She also c/o numbness in bilateral ulnar nerve distribution.  She works as a Transport planner.  She admits she has a lot of stress in life currently with her health issues and her sons new baby had to be in the NICU and soon after, the son had blood clots in both lungs.  She has frequent headaches as well and is a smoker.    Hand dominance: Right  PERTINENT HISTORY:  See above: multiple medical issues  PAIN:  Are you having pain? Yes: NPRS scale: 2/10 Pain location: neck  Pain description: increases to /10 at times, nagging gnawing Aggravating factors: sitting up Relieving factors: showers, ice packs, biofreeze, lidocaine, anti inflammtory  PRECAUTIONS: None  RED FLAGS: None     WEIGHT BEARING RESTRICTIONS: No  FALLS:  Has patient fallen in last 6 months? 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)  PLOF: Independent, Independent with basic ADLs, Independent with household mobility without device, Independent with community mobility without device, Independent with homemaking with ambulation, Independent with gait, and Independent with transfers  PATIENT GOALS: to reduce pain to manageable  NEXT MD VISIT: na  OBJECTIVE:   DIAGNOSTIC FINDINGS:  Bone spurs and facet arthropathy  PATIENT SURVEYS:  FOTO 41,  predicted 51 score based on patient feedback on  current level of activity  COGNITION: Overall cognitive status: Within functional limits for tasks assessed  SENSATION: WFL  POSTURE:  elevated shoulders  PALPATION: Taut bands and trigger point bilateral upper traps and parascapular areas.     CERVICAL ROM:   Active ROM A/PROM (deg) eval 9/27  Flexion 45 60  Extension 50 55  Right lateral flexion 30 32  Left lateral flexion 32 30  Right rotation 45 30  Left rotation 40 20   (Blank rows = not tested)  UPPER EXTREMITY ROM:  WNL  UPPER EXTREMITY MMT: Shoulders generally 3+ to 4-/5,  elbow, forearm and wrist generally 4/5,  12/15/22 - shoulders 4/5 flex/ABD; elbows 4+ to 5/5 B   CERVICAL SPECIAL TESTS:  Spurling's test: Negative   TODAY'S TREATMENT:                                                                                                                              DATE:   12/15/22 Discussion of POC and patient spent 5 min looking for xray results from referring MD. UBE x 4 min 2 fwd/2bwd Seated self traction 3x20 sec  Supine self traction into R rotation 2x20 sec  Manual:  Skilled palpation and monitoring of soft tissues during DN. STM to B UT and cspine. Supine lateral glides and P/A mobs to cspine.  Trigger Point Dry-Needling  Treatment instructions: Expect mild to moderate muscle soreness. S/S of pneumothorax if dry needled over a lung field, and to seek immediate medical attention should they occur. Patient verbalized understanding of these instructions and education. Patient Consent Given: Yes Education handout provided: Previously provided Muscles treated: Left cervical multifidi ;left UT Electrical stimulation performed: No Parameters: N/A Treatment response/outcome: Twitch Response Elicited and Palpable Increase in Muscle Length   12/12/22 UBE x 4 min 2 fwd/2bwd Review of HEP Cervical ROM Manual: Seated manual traction with rotation x 5 ea; Skilled  palpation and monitoring of soft tissues during DN. STM to B UT and cspine.  Trigger Point Dry-Needling  Treatment instructions: Expect mild to moderate muscle soreness. S/S of pneumothorax if dry needled over a lung field, and to seek immediate medical attention should they occur. Patient verbalized understanding of these instructions and education. Patient Consent Given: Yes Education handout provided: Previously provided Muscles treated: Left cervical multifidi ;left UT Electrical stimulation performed: No Parameters: N/A Treatment response/outcome: Twitch Response Elicited and Palpable Increase in Muscle Length  Mechanical cervical traction: 13# intermittent 30 sec on/15 sec off x 10 min 12/08/22 UBE x 4 min 2 fwd/2bwd ER with scap retraction red Tband 2x10 Horizontal ABD green Tband 2x10 Rows 10# x 15 Ext green x20 Diagonals Green x 10 B Ulnar nerve glide in prayer position x 5 5 sec hold B Seated self traction  Manual: Seated manual traction with rotation x 5 L; SNAGS  to left Cspine into rotation and  flex/ext;  Mechanical cervical traction: 13# static x 10 min  11/28/22 UBE  x 4 min 2 fwd/2bwd ER with scap retraction red Tband 2x10 Horizontal ABD green Tband 2x10 Rows blue x20 Ext green x20 Prone T and ext 2# x 16 Prone Y x 15 Ulnar nerve glide in prayer position x 5 5 sec hold B, no change Ulnar nerve glide hand to face L only 5 sec hold x 5 Seated self traction  Manual: Seated manual traction with rotation x 5 L; SNAGS  to left Cspine into R SB and flex/ext;  Mechanical cervical traction: 20# static x 10 min   11/24/22 UBE x 4 min 2 fwd/2bwd ER with scap retraction red Tband 2x10 Horizontal ABD Red Tband 2x10 Rows green x20 Ext green x20 Ulnar nerve glide in prayer position x 5 5 sec hold B Prone on elbows neck diagonals x 10 ea  Manual: Seated manual traction with rotation x 5 ea; Skilled palpation and monitoring of soft tissues during DN. STM to B UT and cspine.  CPA and UPA mobs to cspine, also rotational mobs and lateral glides. PROM in to Rotation. Trigger Point Dry-Needling  Treatment instructions: Expect mild to moderate muscle soreness. S/S of pneumothorax if dry needled over a lung field, and to seek immediate medical attention should they occur. Patient verbalized understanding of these instructions and education. Patient Consent Given: Yes Education handout provided: Previously provided Muscles treated: B cervical multifidi and UT Electrical stimulation performed: No Parameters: N/A Treatment response/outcome: Twitch Response Elicited and Palpable Increase in Muscle Length     PATIENT EDUCATION:  Education details: HEP update Person educated: Patient Education method: Explanation, Demonstration, Verbal cues, and Handouts Education comprehension: verbalized understanding, returned demonstration, and verbal cues required  HOME EXERCISE PROGRAM: Access Code: [email or web address] URL: [email or web address] Date: 12/15/2022 Prepared by: Raynelle Fanning  Exercises - Scapular Retraction with Resistance Advanced  - 2 x daily - 7 x weekly - 2 sets - 10 reps - Scapular Retraction with Resistance  - 2 x daily - 7 x weekly - 2 sets - 10 reps - Shoulder External Rotation and Scapular Retraction with Resistance  - 2 x daily - 7 x weekly - 2 sets - 10 reps - Standing Shoulder Horizontal Abduction with Resistance  - 1 x daily - 7 x weekly - 2 sets - 10 reps - Seated Passive Cervical Retraction  - 1 x daily - 7 x weekly - 3 sets - 10 reps - Seated Cervical Rotation AROM  - 1 x daily - 7 x weekly - 3 sets - 10 reps - Seated Cervical Sidebending AROM  - 1 x daily - 7 x weekly - 3 sets - 10 reps - Seated Cervical Sidebending Stretch  - 1 x daily - 7 x weekly - 3 sets - 10 reps - Seated Levator Scapulae Stretch  - 1 x daily - 7 x weekly - 1 sets - 10 reps - Ulnar Nerve Flossing  - 1 x daily - 7 x weekly - 1 sets - 10 reps - 2-3 hold - Seated Cervical Traction   - 1 sets - 3 reps - 20 sec  hold  ASSESSMENT:  CLINICAL IMPRESSION: Sidnee continues to report no significant improvement. She gets temporary relief with DN and traction, but her rotational ROM has not improved and it feels like she "hits a wall" with left rotation. She continues to report N/T in B hands. She is independent with her HEP and does show improvements in her UE strength since evaluation, meeting LTG #5. She would like to be discharged at  this time due to lack of progress.    OBJECTIVE IMPAIRMENTS: cardiopulmonary status limiting activity, decreased activity tolerance, decreased endurance, decreased ROM, decreased strength, increased fascial restrictions, increased muscle spasms, impaired sensation, impaired UE functional use, postural dysfunction, and pain.   ACTIVITY LIMITATIONS: carrying, lifting, standing, sleeping, transfers, bed mobility, bathing, dressing, reach over head, and hygiene/grooming  PARTICIPATION LIMITATIONS: meal prep, cleaning, laundry, driving, shopping, community activity, occupation, and yard work  PERSONAL FACTORS: Behavior pattern, Fitness, Past/current experiences, Profession, and 3+ comorbidities: see multiple medical issues above, these include anxiety, depression, smoking, RA  are also affecting patient's functional outcome.   REHAB POTENTIAL: Fair due to chronic pain history  CLINICAL DECISION MAKING: Evolving/moderate complexity  EVALUATION COMPLEXITY: Moderate   GOALS: Goals reviewed with patient? Yes  SHORT TERM GOALS: Target date: 12/10/2022   Pain report to be no greater than 4/10  Baseline:  Goal status: met 9/27  2.  Patient will be independent with initial HEP  Baseline:  Goal status: met 9/27   LONG TERM GOALS: Target date: 01/07/2023   Patient to report pain no greater than 2/10  Baseline:  Goal status: NOT MET  2.  Patient to be independent with advanced HEP  Baseline:  Goal status: PARTIALLY MET  3.  C spine ROM to  improve by 3-5 degrees on rotation and side bending Baseline:  Goal status: NOT MET  4.  Patient to report 85% improvement in overall symptoms Baseline:  Goal status: NOT MET  5.  UE strength to improve to 4/5 bilateral shoulders and 4+/5 bilateral elbow, wrist and hand Baseline:  Goal status: MET  6.  Patient to be able to sleep through the night  Baseline:  Goal status: MET (due to sleep aid)   PLAN:  PT FREQUENCY: 1-2x/week  PT DURATION: 8 weeks  PLANNED INTERVENTIONS: Therapeutic exercises, Therapeutic activity, Neuromuscular re-education, Patient/Family education, Self Care, Joint mobilization, Aquatic Therapy, Dry Needling, Electrical stimulation, Spinal mobilization, Cryotherapy, Moist heat, Taping, Traction, Ultrasound, Manual therapy, and Re-evaluation  PLAN FOR NEXT SESSION: see d/c summary   Solon Palm, PT  12/15/22 5:38 PM   [facility] [address] [place], [place] [address] Phone # [phone] Fax [phone]  PHYSICAL THERAPY DISCHARGE SUMMARY  Visits from Start of Care: 7  Current functional level related to goals / functional outcomes: See above   Remaining deficits: See above   Education / Equipment: HEP with Theraband   Patient agrees to discharge. Patient goals were partially met. Patient is being discharged due to lack of progress.   Solon Palm, PT 12/15/22 5:38 PM

## 2022-12-19 ENCOUNTER — Encounter: Payer: BC Managed Care – PPO | Admitting: Physical Therapy

## 2022-12-23 ENCOUNTER — Encounter: Payer: BC Managed Care – PPO | Admitting: Physical Therapy

## 2022-12-26 ENCOUNTER — Encounter: Payer: BC Managed Care – PPO | Admitting: Physical Therapy

## 2022-12-30 ENCOUNTER — Encounter: Payer: BC Managed Care – PPO | Admitting: Physical Therapy

## 2023-01-06 ENCOUNTER — Encounter: Payer: BC Managed Care – PPO | Admitting: Physical Therapy

## 2023-01-30 ENCOUNTER — Other Ambulatory Visit: Payer: Self-pay | Admitting: Family Medicine

## 2023-02-09 DIAGNOSIS — H3561 Retinal hemorrhage, right eye: Secondary | ICD-10-CM | POA: Diagnosis not present

## 2023-02-09 DIAGNOSIS — Z79899 Other long term (current) drug therapy: Secondary | ICD-10-CM | POA: Diagnosis not present

## 2023-02-09 DIAGNOSIS — H04123 Dry eye syndrome of bilateral lacrimal glands: Secondary | ICD-10-CM | POA: Diagnosis not present

## 2023-02-13 ENCOUNTER — Ambulatory Visit
Admission: EM | Admit: 2023-02-13 | Discharge: 2023-02-13 | Disposition: A | Discharge status: Home / Self Care | Payer: BC Managed Care – PPO | Attending: Family Medicine | Admitting: Family Medicine

## 2023-02-13 DIAGNOSIS — J441 Chronic obstructive pulmonary disease with (acute) exacerbation: Secondary | ICD-10-CM

## 2023-02-13 DIAGNOSIS — J069 Acute upper respiratory infection, unspecified: Secondary | ICD-10-CM

## 2023-02-13 HISTORY — DX: Rheumatoid arthritis, unspecified: M06.9

## 2023-02-13 LAB — POC COVID19/FLU A&B COMBO
Covid Antigen, POC: NEGATIVE
Influenza A Antigen, POC: NEGATIVE
Influenza B Antigen, POC: NEGATIVE

## 2023-02-13 MED ORDER — PROMETHAZINE-DM 6.25-15 MG/5ML PO SYRP: 5.0000 mL | ORAL_SOLUTION | Freq: Four times a day (QID) | ORAL | 0 refills | Status: DC | PRN

## 2023-02-13 MED ORDER — ALBUTEROL SULFATE HFA 108 (90 BASE) MCG/ACT IN AERS: 2.0000 | INHALATION_SPRAY | RESPIRATORY_TRACT | 0 refills | Status: DC | PRN

## 2023-02-13 MED ORDER — ALBUTEROL SULFATE (2.5 MG/3ML) 0.083% IN NEBU
2.5000 mg | INHALATION_SOLUTION | Freq: Once | RESPIRATORY_TRACT | Status: AC
  Administered 2023-02-13: 2.5 mg via RESPIRATORY_TRACT

## 2023-02-13 MED ORDER — PREDNISONE 20 MG PO TABS: 40.0000 mg | ORAL_TABLET | Freq: Every day | ORAL | 0 refills | Status: AC

## 2023-02-13 NOTE — ED Triage Notes (Signed)
Pt presents to UC w/ c/o runny nose, wheezing, cough since yesterday. Cough drops help some.

## 2023-02-13 NOTE — ED Provider Notes (Signed)
[facility]    CSN: [id number] Arrival date & time: 02/13/23  1358      History   Chief Complaint No chief complaint on file.   HPI Krystal Mercado is a 52 y.o. female.   HPI Here for nasal congestion and postnasal drainage and cough.  Symptoms began yesterday and then today she has had wheezing and tightness in her chest.  She has used an inhaler in the past, but has not had an inhaler at home for a while.  She does smoke tobacco  Past medical history includes rheumatoid arthritis, for which she takes Plaquenil Past Medical History:  Diagnosis Date   Allergy 2003   allergic rhinitis   Anxiety    Barrett's esophagus    Blood clot in vein    in thumb, had part of thumb amputated, no follow up   GERD (gastroesophageal reflux disease)    Observation or evaluation for suspected condition 04/28/2007   heme Evaluation no known pathology (Dr.Metjian, [facility])   Raynaud's disease    Rheumatoid arthritis (HCC)    Skin necrosis (HCC)    left thumb- vascular eval at [facility]    Patient Active Problem List   Diagnosis Date Noted   Low back pain 05/28/2022   Contact with or exposure to mold 01/22/2022   Left hip pain 01/22/2022   Tachycardia 09/01/2021   History of colonic polyps    Polyp of sigmoid colon    Acute non-recurrent sinusitis 04/22/2021   Bursitis 12/26/2020   Routine general medical examination at a health care facility 11/13/2019   Advance care planning 11/13/2019   Hyperlipidemia 11/13/2019   Complex cyst of right ovary 10/09/2017   Benign neoplasm of transverse colon    Barrett's esophagus    SUI (stress urinary incontinence, female) 05/31/2017   Ocular migraine 05/31/2017   Radicular pain in left arm 05/31/2017   Disorder of skin or subcutaneous tissue 08/31/2007   RAYNAUDS SYNDROME 07/26/2007   VERTIGO 03/25/2007   Anxiety state 11/20/2006   TOBACCO ABUSE 09/22/2006   Allergic rhinitis 09/21/2006   Arthropathy 09/21/2006   HX,  PERSONAL, TOBACCO USE 09/21/2006   Personal history presenting hazards to health 09/21/2006    Past Surgical History:  Procedure Laterality Date   ABDOMINAL HYSTERECTOMY     CHOLECYSTECTOMY  1995   conization, BTL   COLONOSCOPY N/A 05/20/2021   Procedure: COLONOSCOPYn WITH BIOPSY;  Surgeon: Midge Minium, MD;  Location: [facility];  Service: Endoscopy;  Laterality: N/A;   COLONOSCOPY WITH PROPOFOL N/A 08/13/2017   Procedure: COLONOSCOPY WITH PROPOFOL;  Surgeon: Midge Minium, MD;  Location: [facility];  Service: Endoscopy;  Laterality: N/A;   CYSTECTOMY  1990   cyst in fallopian tube removed, Tube intact   ESOPHAGOGASTRODUODENOSCOPY N/A 05/20/2021   Procedure: ESOPHAGOGASTRODUODENOSCOPY (EGD) WITH BIOPSY;  Surgeon: Midge Minium, MD;  Location: [facility];  Service: Endoscopy;  Laterality: N/A;   ESOPHAGOGASTRODUODENOSCOPY (EGD) WITH PROPOFOL N/A 08/13/2017   Procedure: ESOPHAGOGASTRODUODENOSCOPY (EGD) WITH PROPOFOL;  Surgeon: Midge Minium, MD;  Location: [facility];  Service: Endoscopy;  Laterality: N/A;   GROIN DISSECTION  05/2000   Right groin Lymph node dissection for cat scratch Disease   MRI  07/2004   negative except sinusitis   POLYPECTOMY N/A 08/13/2017   Procedure: POLYPECTOMY;  Surgeon: Midge Minium, MD;  Location: [facility];  Service: Endoscopy;  Laterality: N/A;   POLYPECTOMY N/A 05/20/2021   Procedure: POLYPECTOMY;  Surgeon: Midge Minium, MD;  Location:  [facility];  Service: Endoscopy;  Laterality: N/A;   Thoracic aortogram  09/23/2006   Thoracic aortogram with select LUE arteriogram small radial artery below the brach bifurcation   TUBAL LIGATION  1995   conization, BTL; choleycystectomy   VAGINAL DELIVERY     X's 2    OB History     Gravida  2   Para  2   Term  2   Preterm      AB      Living  2      SAB      IAB      Ectopic      Multiple      Live Births               Home Medications     Prior to Admission medications   Medication Sig Start Date End Date Taking? Authorizing Provider  albuterol (VENTOLIN HFA) 108 (90 Base) MCG/ACT inhaler Inhale 2 puffs into the lungs every 4 (four) hours as needed for wheezing or shortness of breath. 02/13/23  Yes Zenia Resides, MD  cholecalciferol (VITAMIN D3) 25 MCG (1000 UNIT) tablet Take 1,000 Units by mouth daily.   Yes [provider]  hydroxychloroquine (PLAQUENIL) 200 MG tablet Take by mouth daily.   Yes [provider]  meloxicam (MOBIC) 7.5 MG tablet Take 7.5 mg by mouth daily.   Yes [provider]  metoprolol tartrate (LOPRESSOR) 25 MG tablet TAKE 0.5-1 TABLETS (12.5-25 MG TOTAL) BY MOUTH 2 (TWO) TIMES DAILY AS NEEDED (FOR FAST HEART RATE). 02/02/23  Yes Joaquim Nam, MD  pantoprazole (PROTONIX) 40 MG tablet TAKE 1 TABLET BY MOUTH EVERY DAY 02/02/23  Yes Joaquim Nam, MD  predniSONE (DELTASONE) 20 MG tablet Take 2 tablets (40 mg total) by mouth daily with breakfast for 5 days. 02/13/23 02/18/23 Yes Zenia Resides, MD  promethazine-dextromethorphan (PROMETHAZINE-DM) 6.25-15 MG/5ML syrup Take 5 mLs by mouth 4 (four) times daily as needed for cough. 02/13/23  Yes Zenia Resides, MD  sertraline (ZOLOFT) 100 MG tablet TAKE 1.5 TABLETS (150MG  TOTAL) BY MOUTH DAILY 02/02/23  Yes Joaquim Nam, MD  Multiple Vitamin (MULTIVITAMIN) tablet Take 1 tablet by mouth daily.    [provider]  Omega-3 Fatty Acids (FISH OIL) 1000 MG CAPS Take 1 capsule (1,000 mg total) by mouth 2 (two) times daily. 12/31/20   Joaquim Nam, MD    Family History Family History  Problem Relation Age of Onset   Cancer Mother 65       breast cancer/lymphoma disease 10/2004   Breast cancer Mother    Hypertension Father    Stroke Father    Cancer Father    Alcohol abuse Brother    Cancer Maternal Grandmother        bone cancer //young in 30's   Alcohol abuse Maternal Grandmother    Stroke Maternal  Grandmother    Colon cancer Maternal Grandfather    Cancer Maternal Grandfather        ? bone cancer./ prostate/colon cancer    Social History Social History   Tobacco Use   Smoking status: Every Day    Types: Cigarettes   Smokeless tobacco: Never   Tobacco comments:    Former smoker as of 2022.  30+ pack years.    Vaping Use   Vaping status: Never Used  Substance Use Topics   Alcohol use: Yes    Alcohol/week: 1.0 - 2.0 standard drink of alcohol  Types: 1 - 2 Glasses of wine per week    Comment: wine at night   Drug use: No     Allergies   Chantix [varenicline tartrate]   Review of Systems Review of Systems   Physical Exam Triage Vital Signs ED Triage Vitals  Encounter Vitals Group     BP 02/13/23 1431 (!) 163/95     Systolic BP Percentile --      Diastolic BP Percentile --      Pulse Rate 02/13/23 1431 81     Resp 02/13/23 1431 18     Temp 02/13/23 1431 98.1 F (36.7 C)     Temp Source 02/13/23 1431 Oral     SpO2 02/13/23 1431 95 %     Weight --      Height --      Head Circumference --      Peak Flow --      Pain Score 02/13/23 1428 0     Pain Loc --      Pain Education --      Exclude from Growth Chart --    No data found.  Updated Vital Signs BP (!) 163/95 (BP Location: Left Arm)   Pulse 81   Temp 98.1 F (36.7 C) (Oral)   Resp 16   SpO2 96% Comment: after albuterol breathing treatment  Visual Acuity Right Eye Distance:   Left Eye Distance:   Bilateral Distance:    Right Eye Near:   Left Eye Near:    Bilateral Near:     Physical Exam Vitals reviewed.  Constitutional:      General: She is not in acute distress.    Appearance: She is not toxic-appearing.  HENT:     Right Ear: Tympanic membrane and ear canal normal.     Left Ear: Tympanic membrane and ear canal normal.     Nose: Nose normal.     Mouth/Throat:     Mouth: Mucous membranes are moist.     Comments: No erythema or asymmetry Eyes:     Extraocular Movements:  Extraocular movements intact.     Conjunctiva/sclera: Conjunctivae normal.     Pupils: Pupils are equal, round, and reactive to light.  Cardiovascular:     Rate and Rhythm: Normal rate and regular rhythm.     Heart sounds: No murmur heard. Pulmonary:     Effort: No respiratory distress.     Breath sounds: No stridor. No rhonchi or rales.     Comments: There is a scant wheeze that is in the expiratory in the lower lung fields. Musculoskeletal:     Cervical back: Neck supple.  Lymphadenopathy:     Cervical: No cervical adenopathy.  Skin:    Capillary Refill: Capillary refill takes less than 2 seconds.     Coloration: Skin is not jaundiced or pale.  Neurological:     General: No focal deficit present.     Mental Status: She is alert and oriented to person, place, and time.  Psychiatric:        Behavior: Behavior normal.      UC Treatments / Results  Labs (all labs ordered are listed, but only abnormal results are displayed) Labs Reviewed  POC COVID19/FLU A&B COMBO    EKG   Radiology No results found.  Procedures Procedures (including critical care time)  Medications Ordered in UC Medications  albuterol (PROVENTIL) (2.5 MG/3ML) 0.083% nebulizer solution 2.5 mg (2.5 mg Nebulization Given 02/13/23 1451)    Initial Impression /  Assessment and Plan / UC Course  I have reviewed the triage vital signs and the nursing notes.  Pertinent labs & imaging results that were available during my care of the patient were reviewed by me and considered in my medical decision making (see chart for details).     COVID and flu tests are negative.  Albuterol and 5-day burst of prednisone are sent in to treat for possible COPD exacerbation and doxycycline is sent in since I think she does have some chronic COPD.  Phenergan dextromethorphan is sent in for the cough Final Clinical Impressions(s) / UC Diagnoses   Final diagnoses:  Viral URI  COPD exacerbation (HCC)     Discharge  Instructions      Your flu and COVID tests were negative  Albuterol inhaler--do 2 puffs every 4 hours as needed for shortness of breath or wheezing  Take prednisone 20 mg--2 daily for 5 days; this is for inflammation in your lungs  Take doxycycline 100 mg --1 capsule 2 times daily for 7 days       ED Prescriptions     Medication Sig Dispense Auth. Provider   albuterol (VENTOLIN HFA) 108 (90 Base) MCG/ACT inhaler Inhale 2 puffs into the lungs every 4 (four) hours as needed for wheezing or shortness of breath. 1 each Zenia Resides, MD   predniSONE (DELTASONE) 20 MG tablet Take 2 tablets (40 mg total) by mouth daily with breakfast for 5 days. 10 tablet Zenia Resides, MD   promethazine-dextromethorphan (PROMETHAZINE-DM) 6.25-15 MG/5ML syrup Take 5 mLs by mouth 4 (four) times daily as needed for cough. 118 mL Zenia Resides, MD      PDMP not reviewed this encounter.   Zenia Resides, MD 02/13/23 [id number]

## 2023-02-13 NOTE — Discharge Instructions (Signed)
Your flu and COVID tests were negative  Albuterol inhaler--do 2 puffs every 4 hours as needed for shortness of breath or wheezing  Take prednisone 20 mg--2 daily for 5 days; this is for inflammation in your lungs  Take doxycycline 100 mg --1 capsule 2 times daily for 7 days

## 2023-02-20 DIAGNOSIS — M2540 Effusion, unspecified joint: Secondary | ICD-10-CM | POA: Diagnosis not present

## 2023-02-20 DIAGNOSIS — M0609 Rheumatoid arthritis without rheumatoid factor, multiple sites: Secondary | ICD-10-CM | POA: Diagnosis not present

## 2023-02-20 DIAGNOSIS — M542 Cervicalgia: Secondary | ICD-10-CM | POA: Diagnosis not present

## 2023-02-20 DIAGNOSIS — M79641 Pain in right hand: Secondary | ICD-10-CM | POA: Diagnosis not present

## 2023-03-03 ENCOUNTER — Other Ambulatory Visit: Payer: Self-pay | Admitting: Family Medicine

## 2023-03-16 DIAGNOSIS — Z79899 Other long term (current) drug therapy: Secondary | ICD-10-CM | POA: Diagnosis not present

## 2023-03-16 DIAGNOSIS — H3561 Retinal hemorrhage, right eye: Secondary | ICD-10-CM | POA: Diagnosis not present

## 2023-05-16 ENCOUNTER — Other Ambulatory Visit: Payer: Self-pay

## 2023-05-16 ENCOUNTER — Encounter: Payer: Self-pay | Admitting: Emergency Medicine

## 2023-05-16 ENCOUNTER — Ambulatory Visit
Admission: EM | Admit: 2023-05-16 | Discharge: 2023-05-16 | Disposition: A | Discharge status: Home / Self Care | Attending: Emergency Medicine | Admitting: Emergency Medicine

## 2023-05-16 DIAGNOSIS — J441 Chronic obstructive pulmonary disease with (acute) exacerbation: Secondary | ICD-10-CM

## 2023-05-16 DIAGNOSIS — J069 Acute upper respiratory infection, unspecified: Secondary | ICD-10-CM

## 2023-05-16 MED ORDER — PREDNISONE 10 MG (21) PO TBPK: ORAL_TABLET | Freq: Every day | ORAL | 0 refills | Status: AC

## 2023-05-16 MED ORDER — ONDANSETRON 4 MG PO TBDP: 4.0000 mg | ORAL_TABLET | Freq: Three times a day (TID) | ORAL | 0 refills | Status: AC | PRN

## 2023-05-16 MED ORDER — ALBUTEROL SULFATE HFA 108 (90 BASE) MCG/ACT IN AERS
2.0000 | INHALATION_SPRAY | RESPIRATORY_TRACT | 2 refills | Status: AC | PRN
Start: 2023-05-16 — End: ?

## 2023-05-16 MED ORDER — METHYLPREDNISOLONE ACETATE 80 MG/ML IJ SUSP
60.0000 mg | Freq: Once | INTRAMUSCULAR | Status: AC
  Administered 2023-05-16: 60 mg via INTRAMUSCULAR

## 2023-05-16 MED ORDER — AZITHROMYCIN 250 MG PO TABS: 250.0000 mg | ORAL_TABLET | Freq: Every day | ORAL | 0 refills | Status: AC

## 2023-05-16 MED ORDER — PROMETHAZINE-DM 6.25-15 MG/5ML PO SYRP: 5.0000 mL | ORAL_SOLUTION | Freq: Every evening | ORAL | 0 refills | Status: AC | PRN

## 2023-05-16 MED ORDER — BENZONATATE 100 MG PO CAPS: 100.0000 mg | ORAL_CAPSULE | Freq: Three times a day (TID) | ORAL | 0 refills | Status: AC

## 2023-05-16 NOTE — ED Triage Notes (Signed)
 Patient presents to [facility] for wvaluation of shortness of breath with cough x 3 days, worsening last night.  Short of breath just sitting in room at this time.

## 2023-05-16 NOTE — Discharge Instructions (Addendum)
 Your symptoms today are most likely being caused by a virus and should steadily improve in time it can take up to 7 to 10 days before you truly start to see a turnaround however things will get better, likely is flaring your COPD which is why you are having shortness of breath and wheezing  You have been given a steroid injection today to open and relax the airway, will also help with headache, ideally will take effect within the hour  Starting tomorrow take prednisone pill every morning as directed  You may use albuterol inhaler taking 2 puffs every 4-6 hours as wheezing and shortness of breath  Begin antibiotic prophylactically to ideally prevent worsening symptoms, take azithromycin as directed  You may use Tessalon pill every 8 hours for coughing and cough syrup at bedtime for comfort    You can take Tylenol and/or Ibuprofen as needed for fever reduction and pain relief.   For cough: honey 1/2 to 1 teaspoon (you can dilute the honey in water or another fluid).  You can also use guaifenesin and dextromethorphan for cough. You can use a humidifier for chest congestion and cough.  If you don't have a humidifier, you can sit in the bathroom with the hot shower running.      For sore throat: try warm salt water gargles, cepacol lozenges, throat spray, warm tea or water with lemon/honey, popsicles or ice, or OTC cold relief medicine for throat discomfort.   For congestion: take a daily anti-histamine like Zyrtec, Claritin, and a oral decongestant, such as pseudoephedrine.  You can also use Flonase 1-2 sprays in each nostril daily.   It is important to stay hydrated: drink plenty of fluids (water, gatorade/powerade/pedialyte, juices, or teas) to keep your throat moisturized and help further relieve irritation/discomfort.

## 2023-05-16 NOTE — ED Provider Notes (Signed)
 Renaldo Fiddler    CSN: [id number] Arrival date & time: 05/16/23  1115      History   Chief Complaint Chief Complaint  Patient presents with   Cough    HPI Krystal Mercado is a 53 y.o. female.   Patient presents for evaluation of nasal congestion, rhinorrhea, nonproductive cough, shortness of breath at rest, wheezing, sore throat and vomiting beginning 3 days ago.  Poor intake able to tolerate fluids.  No known sick contacts prior.  History of COPD.  Has attempted use of Advil.  Patient Active Problem List   Diagnosis Date Noted   Low back pain 05/28/2022   Contact with or exposure to mold 01/22/2022   Left hip pain 01/22/2022   Tachycardia 09/01/2021   History of colonic polyps    Polyp of sigmoid colon    Acute non-recurrent sinusitis 04/22/2021   Bursitis 12/26/2020   Routine general medical examination at a health care facility 11/13/2019   Advance care planning 11/13/2019   Hyperlipidemia 11/13/2019   Complex cyst of right ovary 10/09/2017   Benign neoplasm of transverse colon    Barrett's esophagus    SUI (stress urinary incontinence, female) 05/31/2017   Ocular migraine 05/31/2017   Radicular pain in left arm 05/31/2017   Disorder of skin or subcutaneous tissue 08/31/2007   RAYNAUDS SYNDROME 07/26/2007   VERTIGO 03/25/2007   Anxiety state 11/20/2006   TOBACCO ABUSE 09/22/2006   Allergic rhinitis 09/21/2006   Arthropathy 09/21/2006   HX, PERSONAL, TOBACCO USE 09/21/2006   Personal history presenting hazards to health 09/21/2006    Past Surgical History:  Procedure Laterality Date   ABDOMINAL HYSTERECTOMY     CHOLECYSTECTOMY  1995   conization, BTL   COLONOSCOPY N/A 05/20/2021   Procedure: COLONOSCOPYn WITH BIOPSY;  Surgeon: Midge Minium, MD;  Location: [facility];  Service: Endoscopy;  Laterality: N/A;   COLONOSCOPY WITH PROPOFOL N/A 08/13/2017   Procedure: COLONOSCOPY WITH PROPOFOL;  Surgeon: Midge Minium, MD;  Location: [facility];  Service: Endoscopy;  Laterality: N/A;   CYSTECTOMY  1990   cyst in fallopian tube removed, Tube intact   ESOPHAGOGASTRODUODENOSCOPY N/A 05/20/2021   Procedure: ESOPHAGOGASTRODUODENOSCOPY (EGD) WITH BIOPSY;  Surgeon: Midge Minium, MD;  Location: [facility];  Service: Endoscopy;  Laterality: N/A;   ESOPHAGOGASTRODUODENOSCOPY (EGD) WITH PROPOFOL N/A 08/13/2017   Procedure: ESOPHAGOGASTRODUODENOSCOPY (EGD) WITH PROPOFOL;  Surgeon: Midge Minium, MD;  Location: [facility];  Service: Endoscopy;  Laterality: N/A;   GROIN DISSECTION  05/2000   Right groin Lymph node dissection for cat scratch Disease   MRI  07/2004   negative except sinusitis   POLYPECTOMY N/A 08/13/2017   Procedure: POLYPECTOMY;  Surgeon: Midge Minium, MD;  Location: [facility];  Service: Endoscopy;  Laterality: N/A;   POLYPECTOMY N/A 05/20/2021   Procedure: POLYPECTOMY;  Surgeon: Midge Minium, MD;  Location: [facility];  Service: Endoscopy;  Laterality: N/A;   Thoracic aortogram  09/23/2006   Thoracic aortogram with select LUE arteriogram small radial artery below the brach bifurcation   TUBAL LIGATION  1995   conization, BTL; choleycystectomy   VAGINAL DELIVERY     X's 2    OB History     Gravida  2   Para  2   Term  2   Preterm      AB      Living  2      SAB      IAB  Ectopic      Multiple      Live Births               Home Medications    Prior to Admission medications   Medication Sig Start Date End Date Taking? Authorizing Provider  azithromycin (ZITHROMAX) 250 MG tablet Take 1 tablet (250 mg total) by mouth daily. Take first 2 tablets together, then 1 every day until finished. 05/16/23  Yes Ahja Martello R, NP  benzonatate (TESSALON) 100 MG capsule Take 1 capsule (100 mg total) by mouth every 8 (eight) hours. 05/16/23  Yes Linzy Laury R, NP  predniSONE (STERAPRED UNI-PAK 21 TAB) 10 MG (21) TBPK tablet Take by mouth daily. Take 6 tabs by mouth  daily  for 1 days, then 5 tabs for 1 days, then 4 tabs for 1 days, then 3 tabs for 1 days, 2 tabs for 1 days, then 1 tab by mouth daily for 1 days 05/16/23  Yes Tabathia Knoche R, NP  promethazine-dextromethorphan (PROMETHAZINE-DM) 6.25-15 MG/5ML syrup Take 5 mLs by mouth at bedtime as needed. 05/16/23  Yes Adenike Shidler R, NP  albuterol (VENTOLIN HFA) 108 (90 Base) MCG/ACT inhaler Inhale 2 puffs into the lungs every 4 (four) hours as needed for wheezing or shortness of breath. 05/16/23   Valinda Hoar, NP  cholecalciferol (VITAMIN D3) 25 MCG (1000 UNIT) tablet Take 1,000 Units by mouth daily.    [provider]  hydroxychloroquine (PLAQUENIL) 200 MG tablet Take by mouth daily.    [provider]  meloxicam (MOBIC) 7.5 MG tablet Take 7.5 mg by mouth daily.    [provider]  metoprolol tartrate (LOPRESSOR) 25 MG tablet TAKE 0.5-1 TABLETS (12.5-25 MG TOTAL) BY MOUTH 2 (TWO) TIMES DAILY AS NEEDED (FOR FAST HEART RATE). 02/02/23   Joaquim Nam, MD  Multiple Vitamin (MULTIVITAMIN) tablet Take 1 tablet by mouth daily.    [provider]  Omega-3 Fatty Acids (FISH OIL) 1000 MG CAPS Take 1 capsule (1,000 mg total) by mouth 2 (two) times daily. 12/31/20   Joaquim Nam, MD  pantoprazole (PROTONIX) 40 MG tablet TAKE 1 TABLET BY MOUTH EVERY DAY 02/02/23   Joaquim Nam, MD  sertraline (ZOLOFT) 100 MG tablet TAKE 1.5 TABLETS (150MG  TOTAL) BY MOUTH DAILY 02/02/23   Joaquim Nam, MD    Family History Family History  Problem Relation Age of Onset   Cancer Mother 46       breast cancer/lymphoma disease 10/2004   Breast cancer Mother    Hypertension Father    Stroke Father    Cancer Father    Alcohol abuse Brother    Cancer Maternal Grandmother        bone cancer //young in 30's   Alcohol abuse Maternal Grandmother    Stroke Maternal Grandmother    Colon cancer Maternal Grandfather    Cancer Maternal Grandfather        ? bone cancer./ prostate/colon  cancer    Social History Social History   Tobacco Use   Smoking status: Every Day    Types: Cigarettes   Smokeless tobacco: Never   Tobacco comments:    Former smoker as of 2022.  30+ pack years.    Vaping Use   Vaping status: Never Used  Substance Use Topics   Alcohol use: Yes    Alcohol/week: 1.0 - 2.0 standard drink of alcohol    Types: 1 - 2 Glasses of wine per week    Comment:  wine at night   Drug use: No     Allergies   Chantix [varenicline tartrate]   Review of Systems Review of Systems   Physical Exam Triage Vital Signs ED Triage Vitals  Encounter Vitals Group     BP 05/16/23 1243 (!) 188/100     Systolic BP Percentile --      Diastolic BP Percentile --      Pulse Rate 05/16/23 1243 (!) 106     Resp 05/16/23 1243 18     Temp 05/16/23 1243 97.8 F (36.6 C)     Temp Source 05/16/23 1243 Temporal     SpO2 05/16/23 1243 93 %     Weight --      Height --      Head Circumference --      Peak Flow --      Pain Score 05/16/23 1238 8     Pain Loc --      Pain Education --      Exclude from Growth Chart --    No data found.  Updated Vital Signs BP (!) 188/100 (BP Location: Left Arm)   Pulse (!) 106   Temp 97.8 F (36.6 C) (Temporal)   Resp 18   SpO2 93%   Visual Acuity Right Eye Distance:   Left Eye Distance:   Bilateral Distance:    Right Eye Near:   Left Eye Near:    Bilateral Near:     Physical Exam Constitutional:      Appearance: Normal appearance.  HENT:     Head: Normocephalic.     Right Ear: Tympanic membrane, ear canal and external ear normal.     Left Ear: Tympanic membrane, ear canal and external ear normal.     Nose: Congestion and rhinorrhea present.     Mouth/Throat:     Mouth: Mucous membranes are moist.     Pharynx: Oropharynx is clear.  Eyes:     Extraocular Movements: Extraocular movements intact.  Cardiovascular:     Rate and Rhythm: Normal rate and regular rhythm.     Pulses: Normal pulses.     Heart sounds:  Normal heart sounds.  Pulmonary:     Breath sounds: Normal breath sounds.     Comments: labored Neurological:     Mental Status: She is alert and oriented to person, place, and time. Mental status is at baseline.      UC Treatments / Results  Labs (all labs ordered are listed, but only abnormal results are displayed) Labs Reviewed  POC COVID19/FLU A&B COMBO    EKG   Radiology No results found.  Procedures Procedures (including critical care time)  Medications Ordered in UC Medications  methylPREDNISolone acetate (DEPO-MEDROL) injection 60 mg (60 mg Intramuscular Given 05/16/23 1304)    Initial Impression / Assessment and Plan / UC Course  I have reviewed the triage vital signs and the nursing notes.  Pertinent labs & imaging results that were available during my care of the patient were reviewed by me and considered in my medical decision making (see chart for details).  Viral URI with cough, COPD exacerbation  Patient is in no signs of distress nor toxic appearing.  Vital signs are stable.  Low suspicion for pneumonia, pneumothorax or bronchitis and therefore will defer imaging.  COVID and flu negative.  Viral etiology most likely flaring COPD.  Prophylactically placed on azithromycin, prednisone, Tessalon, Promethazine DM and Zofran prescribed. May use additional over-the-counter medications as needed for supportive care.  May follow-up with urgent care as needed if symptoms persist or worsen.  Note given.    Final Clinical Impressions(s) / UC Diagnoses   Final diagnoses:  Viral URI with cough  COPD exacerbation (HCC)     Discharge Instructions      Your symptoms today are most likely being caused by a virus and should steadily improve in time it can take up to 7 to 10 days before you truly start to see a turnaround however things will get better, likely is flaring your COPD which is why you are having shortness of breath and wheezing  You have been given a  steroid injection today to open and relax the airway, will also help with headache, ideally will take effect within the hour  Starting tomorrow take prednisone pill every morning as directed  You may use albuterol inhaler taking 2 puffs every 4-6 hours as wheezing and shortness of breath  Begin antibiotic prophylactically to ideally prevent worsening symptoms, take azithromycin as directed  You may use Tessalon pill every 8 hours for coughing and cough syrup at bedtime for comfort    You can take Tylenol and/or Ibuprofen as needed for fever reduction and pain relief.   For cough: honey 1/2 to 1 teaspoon (you can dilute the honey in water or another fluid).  You can also use guaifenesin and dextromethorphan for cough. You can use a humidifier for chest congestion and cough.  If you don't have a humidifier, you can sit in the bathroom with the hot shower running.      For sore throat: try warm salt water gargles, cepacol lozenges, throat spray, warm tea or water with lemon/honey, popsicles or ice, or OTC cold relief medicine for throat discomfort.   For congestion: take a daily anti-histamine like Zyrtec, Claritin, and a oral decongestant, such as pseudoephedrine.  You can also use Flonase 1-2 sprays in each nostril daily.   It is important to stay hydrated: drink plenty of fluids (water, gatorade/powerade/pedialyte, juices, or teas) to keep your throat moisturized and help further relieve irritation/discomfort.    ED Prescriptions     Medication Sig Dispense Auth. Provider   predniSONE (STERAPRED UNI-PAK 21 TAB) 10 MG (21) TBPK tablet Take by mouth daily. Take 6 tabs by mouth daily  for 1 days, then 5 tabs for 1 days, then 4 tabs for 1 days, then 3 tabs for 1 days, 2 tabs for 1 days, then 1 tab by mouth daily for 1 days 21 tablet Madysin Crisp R, NP   azithromycin (ZITHROMAX) 250 MG tablet Take 1 tablet (250 mg total) by mouth daily. Take first 2 tablets together, then 1 every day until  finished. 6 tablet Timberlyn Pickford R, NP   benzonatate (TESSALON) 100 MG capsule Take 1 capsule (100 mg total) by mouth every 8 (eight) hours. 21 capsule Buzz Axel R, NP   promethazine-dextromethorphan (PROMETHAZINE-DM) 6.25-15 MG/5ML syrup Take 5 mLs by mouth at bedtime as needed. 118 mL Mardell Cragg R, NP   albuterol (VENTOLIN HFA) 108 (90 Base) MCG/ACT inhaler Inhale 2 puffs into the lungs every 4 (four) hours as needed for wheezing or shortness of breath. 1 each Valinda Hoar, NP      PDMP not reviewed this encounter.   Valinda Hoar, NP 05/16/23 1312

## 2023-05-29 DIAGNOSIS — M542 Cervicalgia: Secondary | ICD-10-CM | POA: Diagnosis not present

## 2023-05-29 DIAGNOSIS — M2540 Effusion, unspecified joint: Secondary | ICD-10-CM | POA: Diagnosis not present

## 2023-05-29 DIAGNOSIS — M79641 Pain in right hand: Secondary | ICD-10-CM | POA: Diagnosis not present

## 2023-05-29 DIAGNOSIS — M0609 Rheumatoid arthritis without rheumatoid factor, multiple sites: Secondary | ICD-10-CM | POA: Diagnosis not present

## 2023-06-10 ENCOUNTER — Other Ambulatory Visit: Payer: Self-pay | Admitting: Family Medicine

## 2023-06-20 ENCOUNTER — Other Ambulatory Visit: Payer: Self-pay | Admitting: Family Medicine

## 2023-07-05 ENCOUNTER — Other Ambulatory Visit: Payer: Self-pay | Admitting: Family Medicine

## 2023-07-06 NOTE — Telephone Encounter (Signed)
 Patient is due for CPE, please schedule and route back.

## 2023-07-06 NOTE — Telephone Encounter (Signed)
 Lvmtcb, sent mychart message

## 2023-07-07 NOTE — Telephone Encounter (Signed)
 Pt has moved out of town pt stated she has a new doctor

## 2023-07-07 NOTE — Telephone Encounter (Signed)
 Noted removing Dr. Vallarie Gauze as PCP.

## 2023-07-07 NOTE — Telephone Encounter (Signed)
 Lvmtcb

## 2023-07-16 ENCOUNTER — Other Ambulatory Visit: Payer: Self-pay | Admitting: Family Medicine

## 2023-07-16 NOTE — Telephone Encounter (Signed)
 LOV: 05/26/22 [id number] SCHEDULED  LAST REFILL: sertraline  (ZOLOFT ) 100 MG tablet 06/22/23 45 TABLETS 0 REFILLS

## 2023-07-17 ENCOUNTER — Other Ambulatory Visit: Payer: Self-pay

## 2023-07-17 ENCOUNTER — Other Ambulatory Visit (HOSPITAL_COMMUNITY): Payer: Self-pay

## 2023-07-17 ENCOUNTER — Other Ambulatory Visit (HOSPITAL_BASED_OUTPATIENT_CLINIC_OR_DEPARTMENT_OTHER): Payer: Self-pay

## 2023-07-17 MED ORDER — SERTRALINE HCL 100 MG PO TABS
ORAL_TABLET | ORAL | 2 refills | Status: DC
  Filled 2023-07-17 – 2023-10-05 (×2): qty 45, 30d supply, fill #0

## 2023-07-22 ENCOUNTER — Other Ambulatory Visit: Payer: Self-pay

## 2023-08-16 ENCOUNTER — Other Ambulatory Visit: Payer: Self-pay | Admitting: Family Medicine

## 2023-10-05 ENCOUNTER — Other Ambulatory Visit (HOSPITAL_COMMUNITY): Payer: Self-pay

## 2023-10-21 ENCOUNTER — Other Ambulatory Visit: Payer: Self-pay | Admitting: Family Medicine

## 2024-02-19 ENCOUNTER — Other Ambulatory Visit: Payer: Self-pay | Admitting: Family Medicine

## 2024-02-19 DIAGNOSIS — F411 Generalized anxiety disorder: Secondary | ICD-10-CM

## 2024-02-19 NOTE — Telephone Encounter (Signed)
 E-scribed refill. Pt last seen 05/26/22.   Pt schedule CPE and fasting labs for additional refills.

## 2024-03-04 NOTE — Telephone Encounter (Signed)
 lvm for pt to call office to schedule appt.

## 2024-03-07 NOTE — Telephone Encounter (Signed)
 No vm set up to leave messages.

## 2024-03-18 ENCOUNTER — Other Ambulatory Visit: Payer: Self-pay | Admitting: Family Medicine

## 2024-03-18 DIAGNOSIS — F411 Generalized anxiety disorder: Secondary | ICD-10-CM

## 2024-03-21 NOTE — Telephone Encounter (Signed)
 Please send once she is scheduled.  Thanks.

## 2024-03-21 NOTE — Telephone Encounter (Signed)
 Office has called patient to set up office visit no call back ok to refill?

## 2024-03-29 NOTE — Telephone Encounter (Signed)
 I called and LVM.

## 2024-04-14 NOTE — Telephone Encounter (Signed)
 lvm for pt to call office to schedule appt.

## 2024-04-20 NOTE — Telephone Encounter (Signed)
"  Lvm to call office     "

## 2024-04-21 NOTE — Telephone Encounter (Signed)
 Left my chart message.

## 2024-04-22 NOTE — Telephone Encounter (Signed)
 Called and schedule pt for ov

## 2024-05-13 ENCOUNTER — Ambulatory Visit: Admitting: Family Medicine
